# Patient Record
Sex: Male | Born: 1990 | Race: White | Hispanic: No | Marital: Single | Smoking: Never smoker
Health system: Southern US, Community
[De-identification: ages and names within clinical notes are randomized; demographics above are authoritative.]

## PROBLEM LIST (undated history)

## (undated) VITALS — BP 114/79 | HR 103 | Temp 97.7°F | Resp 18 | Ht 70.0 in | Wt 180.0 lb

## (undated) DIAGNOSIS — J45909 Unspecified asthma, uncomplicated: Secondary | ICD-10-CM

## (undated) DIAGNOSIS — T7840XA Allergy, unspecified, initial encounter: Secondary | ICD-10-CM

## (undated) HISTORY — PX: JOINT REPLACEMENT: SHX530

## (undated) HISTORY — DX: Allergy, unspecified, initial encounter: T78.40XA

---
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
## 2012-03-06 ENCOUNTER — Emergency Department (HOSPITAL_COMMUNITY): Payer: BC Managed Care – PPO

## 2012-03-06 ENCOUNTER — Inpatient Hospital Stay (HOSPITAL_COMMUNITY)
Admission: EM | Admit: 2012-03-06 | Discharge: 2012-03-07 | DRG: 750 | Disposition: A | Discharge status: Home / Self Care | Payer: BC Managed Care – PPO | Attending: Internal Medicine | Admitting: Internal Medicine

## 2012-03-06 ENCOUNTER — Encounter (HOSPITAL_COMMUNITY): Payer: Self-pay | Admitting: *Deleted

## 2012-03-06 DIAGNOSIS — F1010 Alcohol abuse, uncomplicated: Secondary | ICD-10-CM

## 2012-03-06 DIAGNOSIS — R404 Transient alteration of awareness: Secondary | ICD-10-CM

## 2012-03-06 DIAGNOSIS — L708 Other acne: Secondary | ICD-10-CM | POA: Diagnosis present

## 2012-03-06 DIAGNOSIS — F10929 Alcohol use, unspecified with intoxication, unspecified: Secondary | ICD-10-CM

## 2012-03-06 DIAGNOSIS — R4182 Altered mental status, unspecified: Secondary | ICD-10-CM | POA: Diagnosis present

## 2012-03-06 DIAGNOSIS — Z79899 Other long term (current) drug therapy: Secondary | ICD-10-CM

## 2012-03-06 DIAGNOSIS — J9600 Acute respiratory failure, unspecified whether with hypoxia or hypercapnia: Secondary | ICD-10-CM | POA: Diagnosis present

## 2012-03-06 DIAGNOSIS — R401 Stupor: Secondary | ICD-10-CM

## 2012-03-06 DIAGNOSIS — F1020 Alcohol dependence, uncomplicated: Secondary | ICD-10-CM | POA: Diagnosis present

## 2012-03-06 DIAGNOSIS — J45909 Unspecified asthma, uncomplicated: Secondary | ICD-10-CM | POA: Diagnosis present

## 2012-03-06 DIAGNOSIS — R1110 Vomiting, unspecified: Secondary | ICD-10-CM | POA: Diagnosis present

## 2012-03-06 HISTORY — DX: Unspecified asthma, uncomplicated: J45.909

## 2012-03-06 LAB — BLOOD GAS, ARTERIAL
MECHVT: 0.53 mL
PEEP: 5 cmH2O
Patient temperature: 97
pH, Arterial: 7.43 (ref 7.350–7.450)

## 2012-03-06 LAB — CBC WITH DIFFERENTIAL/PLATELET
Basophils Relative: 0 % (ref 0–1)
HCT: 44.3 % (ref 39.0–52.0)
Hemoglobin: 15.5 g/dL (ref 13.0–17.0)
Lymphocytes Relative: 14 % (ref 12–46)
Lymphs Abs: 1.6 10*3/uL (ref 0.7–4.0)
Monocytes Absolute: 0.4 10*3/uL (ref 0.1–1.0)
Monocytes Relative: 4 % (ref 3–12)
Neutro Abs: 9.7 10*3/uL — ABNORMAL HIGH (ref 1.7–7.7)
Neutrophils Relative %: 82 % — ABNORMAL HIGH (ref 43–77)
RBC: 5.06 MIL/uL (ref 4.22–5.81)
WBC: 11.8 10*3/uL — ABNORMAL HIGH (ref 4.0–10.5)

## 2012-03-06 LAB — COMPREHENSIVE METABOLIC PANEL
Albumin: 4.2 g/dL (ref 3.5–5.2)
Alkaline Phosphatase: 72 U/L (ref 39–117)
BUN: 10 mg/dL (ref 6–23)
CO2: 25 mEq/L (ref 19–32)
Chloride: 102 mEq/L (ref 96–112)
GFR calc non Af Amer: >90 mL/min (ref >90)
Glucose, Bld: 131 mg/dL — ABNORMAL HIGH (ref 70–99)
Potassium: 4.2 mEq/L (ref 3.5–5.1)
Total Bilirubin: 0.2 mg/dL — ABNORMAL LOW (ref 0.3–1.2)

## 2012-03-06 LAB — URINALYSIS, MICROSCOPIC ONLY
Glucose, UA: NEGATIVE mg/dL
Ketones, ur: NEGATIVE mg/dL
Leukocytes, UA: NEGATIVE
Protein, ur: NEGATIVE mg/dL
Urobilinogen, UA: 0.2 mg/dL (ref 0.0–1.0)

## 2012-03-06 LAB — MRSA PCR SCREENING: MRSA by PCR: INVALID — AB

## 2012-03-06 LAB — RAPID URINE DRUG SCREEN, HOSP PERFORMED
Barbiturates: NOT DETECTED
Tetrahydrocannabinol: NOT DETECTED

## 2012-03-06 MED ORDER — HEPARIN SODIUM (PORCINE) 5000 UNIT/ML IJ SOLN
5000.0000 [IU] | Freq: Three times a day (TID) | INTRAMUSCULAR | Status: DC
  Administered 2012-03-06 – 2012-03-07 (×4): 5000 [IU] via SUBCUTANEOUS
  Filled 2012-03-06 (×7): qty 1

## 2012-03-06 MED ORDER — AMMONIA AROMATIC IN INHA
RESPIRATORY_TRACT | Status: AC
  Administered 2012-03-06: 04:00:00
  Filled 2012-03-06: qty 20

## 2012-03-06 MED ORDER — ONDANSETRON HCL 4 MG/2ML IJ SOLN
4.0000 mg | Freq: Once | INTRAMUSCULAR | Status: AC
  Administered 2012-03-06: 4 mg via INTRAVENOUS
  Filled 2012-03-06: qty 2

## 2012-03-06 MED ORDER — BIOTENE DRY MOUTH MT LIQD
15.0000 mL | Freq: Four times a day (QID) | OROMUCOSAL | Status: DC
  Administered 2012-03-06: 15 mL via OROMUCOSAL

## 2012-03-06 MED ORDER — FAMOTIDINE 40 MG/5ML PO SUSR: 20.0000 mg | Freq: Two times a day (BID) | ORAL | Status: DC

## 2012-03-06 MED ORDER — ETOMIDATE 2 MG/ML IV SOLN
INTRAVENOUS | Status: AC
  Administered 2012-03-06: 20 mg
  Filled 2012-03-06: qty 20

## 2012-03-06 MED ORDER — PROPOFOL 10 MG/ML IV EMUL
5.0000 ug/kg/min | INTRAVENOUS | Status: DC
  Administered 2012-03-06: 20 ug/kg/min via INTRAVENOUS

## 2012-03-06 MED ORDER — CHLORHEXIDINE GLUCONATE 0.12 % MT SOLN
15.0000 mL | Freq: Two times a day (BID) | OROMUCOSAL | Status: DC
  Administered 2012-03-06: 15 mL via OROMUCOSAL
  Filled 2012-03-06: qty 15

## 2012-03-06 MED ORDER — SODIUM CHLORIDE 0.9 % IV SOLN
INTRAVENOUS | Status: DC
  Administered 2012-03-06: 125 mL via INTRAVENOUS

## 2012-03-06 MED ORDER — SUCCINYLCHOLINE CHLORIDE 20 MG/ML IJ SOLN
INTRAMUSCULAR | Status: AC
  Administered 2012-03-06: 120 mg
  Filled 2012-03-06: qty 5

## 2012-03-06 MED ORDER — PROPOFOL 10 MG/ML IV EMUL
INTRAVENOUS | Status: AC
  Administered 2012-03-06: 20 ug/kg/min
  Filled 2012-03-06: qty 100

## 2012-03-06 MED ORDER — LIDOCAINE HCL (CARDIAC) 20 MG/ML IV SOLN
INTRAVENOUS | Status: AC
  Filled 2012-03-06: qty 5

## 2012-03-06 MED ORDER — RANITIDINE HCL 150 MG/10ML PO SYRP
150.0000 mg | ORAL_SOLUTION | Freq: Two times a day (BID) | ORAL | Status: DC
  Administered 2012-03-06 (×2): 150 mg
  Filled 2012-03-06 (×4): qty 10

## 2012-03-06 MED ORDER — ROCURONIUM BROMIDE 50 MG/5ML IV SOLN
INTRAVENOUS | Status: AC
  Filled 2012-03-06: qty 2

## 2012-03-06 MED ORDER — PROPOFOL 10 MG/ML IV EMUL: 5.0000 ug/kg/min | Freq: Once | INTRAVENOUS | Status: DC

## 2012-03-06 MED ORDER — LEVOFLOXACIN IN D5W 500 MG/100ML IV SOLN
500.0000 mg | Freq: Once | INTRAVENOUS | Status: AC
  Administered 2012-03-06: 500 mg via INTRAVENOUS
  Filled 2012-03-06: qty 100

## 2012-03-06 MED ORDER — FENTANYL CITRATE 0.05 MG/ML IJ SOLN: 50.0000 ug | INTRAMUSCULAR | Status: DC | PRN

## 2012-03-06 MED ORDER — SODIUM CHLORIDE 0.9 % IV SOLN: 250.0000 mL | INTRAVENOUS | Status: DC | PRN

## 2012-03-06 NOTE — ED Notes (Signed)
Bed:WA07<BR> Expected date:<BR> Expected time:<BR> Means of arrival:<BR> Comments:<BR>

## 2012-03-06 NOTE — ED Notes (Signed)
CT scan completed, pt tolerated well. RT at bedside for BG

## 2012-03-06 NOTE — H&P (Signed)
Name: Louis Woodward MRN: [id number] DOB: 1990/10/31    LOS: 0  PULMONARY / CRITICAL CARE MEDICINE  HPI:  Louis Woodward is an 21 year-old gentleman with a past medical history significant for asthma and acne, who ingested a large quantity of alcohol over 60-90 minutes.  He began vomiting, and became lethargic, and he was subsequently brought to the ED.  He continued to be unresponsive and was not protecting his airway, and he was intubated.  Past Medical History  Diagnosis Date  . Asthma    History reviewed. No pertinent past surgical history. Prior to Admission medications   Medication Sig Start Date End Date Taking? Authorizing Provider  doxycycline (VIBRA-TABS) 100 MG tablet Take 100 mg by mouth 2 (two) times daily.   Yes Historical Provider, MD   Allergies Allergies  Allergen Reactions  . Cefazolin Rash    Family History No family history on file. Social History  does not have a smoking history on file. He does not have any smokeless tobacco history on file. He reports that he drinks alcohol. His drug history not on file.  Review Of Systems:  Unable to provide.  Brief patient description:  21 year-old male with acute alcohol intoxication  Events Since Admission: None  Current Status: Stable  Vital Signs: Temp:  [95.7 F (35.4 C)-97.5 F (36.4 C)] 96 F (35.6 C) (09/28 0429) Pulse Rate:  [78-98] 95  (09/28 0429) Resp:  [11-20] 16  (09/28 0429) BP: (104-136)/(60-89) 122/69 mmHg (09/28 0429) SpO2:  [100 %] 100 % (09/28 0429) FiO2 (%):  [100 %] 100 % (09/28 0413) Weight:  [83.915 kg (185 lb)] 83.915 kg (185 lb) (09/28 0411)  Physical Examination: General:  Intubated, mechanically ventilated, no acute distress Neuro:  Sedated, synchronous, still unresponsive HEENT:  PERRL, pink conjunctivae, moist membranes Neck:  Supple, no JVD   Cardiovascular:  RRR, no M/R/G Lungs:  Bilateral diminished air entry, no W/R/R Abdomen:  Soft, nontender, nondistended, bowel sounds  present Musculoskeletal:  Moves all extremities, no pedal edema Skin:  No rash  Principal Problem:  *Acute alcohol intoxication Active Problems:  Acute respiratory failure   ASSESSMENT AND PLAN  PULMONARY No results found for this basename: PHART:5,PCO2:5,PCO2ART:5,PO2ART:5,HCO3:5,O2SAT:5 in the last 168 hours Ventilator Settings: Vent Mode:  [-] PRVC FiO2 (%):  [100 %] 100 % Set Rate:  [15 bmp] 15 bmp Vt Set:  [600 mL] 600 mL PEEP:  [5 cmH20] 5 cmH20 Plateau Pressure:  [14 cmH20] 14 cmH20 CXR:  No infiltrates ETT:  9/28 >>>  A:  Acute respiratory failure P:   -Will need to metabolize the alcohol, and will likely be able to be extubated later today. -Will get a single dose of levofloxacin for VAP prohpylaxis  NEUROLOGIC  A:  Altered mental status, secondary to acute alcohol intoxication P:   -Will allow to wake up.  BEST PRACTICE / DISPOSITION - Level of Care:  ICU - Primary Service:  PCCM - Consultants:  None - Code Status:  Full - Diet:  NPO - DVT Px:  Heparin - GI Px:  Famotidine - Social / Family:  Family updated, questions answered at bedside.  The patient is critically ill with multiple organ systems failure and requires high complexity decision making for assessment and support, frequent evaluation and titration of therapies, application of advanced monitoring technologies and extensive interpretation of multiple databases.   Critical Care Time devoted to patient care services described in this note is: 60 Minutes  Lavella Hammock, M.D. Pulmonary and Critical  Care Medicine [facility] Pager: [phone]  03/06/2012, 4:56 AM

## 2012-03-06 NOTE — ED Provider Notes (Signed)
History     CSN: [id number]  Arrival date & time 03/06/12  0243   First MD Initiated Contact with Patient 03/06/12 0300      Chief Complaint  Patient presents with  . Alcohol Intoxication    (Consider location/radiation/quality/duration/timing/severity/associated sxs/prior treatment) HPI 21 year old male presents to emergency room with reported alcohol intoxication. Patient is unable to give history. Patient was out at a party with friends, brought back from a party in the flat bed of the truck, EMS reports vomiting in route. Family members at bedside, unable to give any further history. They report no other friends are giving any additional history.  Past Medical History  Diagnosis Date  . Asthma     History reviewed. No pertinent past surgical history.  No family history on file.  History  Substance Use Topics  . Smoking status: Not on file  . Smokeless tobacco: Not on file  . Alcohol Use: Yes      Review of Systems  Unable to perform ROS: Mental status change   intoxication  Allergies  Cefazolin  Home Medications   Current Outpatient Rx  Name Route Sig Dispense Refill  . DOXYCYCLINE HYCLATE 100 MG PO TABS Oral Take 100 mg by mouth 2 (two) times daily.      BP 136/89  Pulse 89  Temp 97.5 F (36.4 C) (Oral)  Resp 18  SpO2 100%  Physical Exam  Nursing note and vitals reviewed. Constitutional:       Patient is unresponsive other than to pain initially, then to voice briefly.  HENT:  Head: Normocephalic and atraumatic.  Right Ear: External ear normal.  Left Ear: External ear normal.  Nose: Nose normal.  Mouth/Throat: Oropharynx is clear and moist.  Eyes: Conjunctivae normal and EOM are normal. Pupils are equal, round, and reactive to light.  Neck: Normal range of motion. Neck supple. No JVD present. No tracheal deviation present. No thyromegaly present.  Cardiovascular: Normal rate, regular rhythm, normal heart sounds and intact distal pulses.  Exam  reveals no gallop and no friction rub.   No murmur heard. Pulmonary/Chest: Effort normal and breath sounds normal. No stridor. No respiratory distress. He has no wheezes. He has no rales. He exhibits no tenderness.  Abdominal: Soft. Bowel sounds are normal. He exhibits no distension and no mass. There is no tenderness. There is no rebound and no guarding.  Musculoskeletal: He exhibits no edema and no tenderness.  Lymphadenopathy:    He has no cervical adenopathy.  Skin: Skin is warm and dry. No rash noted. He is not diaphoretic. No erythema. No pallor.    ED Course  INTUBATION Date/Time: 03/06/2012 4:02 AM Performed by: Olivia Mackie Authorized by: Olivia Mackie Consent: The procedure was performed in an emergent situation. Risks and benefits: risks, benefits and alternatives were discussed Consent given by: parent Patient identity confirmed: verbally with patient Time out: Immediately prior to procedure a "time out" was called to verify the correct patient, procedure, equipment, support staff and site/side marked as required. Indications: airway protection Intubation method: video-assisted Patient status: paralyzed (RSI) Preoxygenation: BVM Sedatives: etomidate Paralytic: succinylcholine Laryngoscope size: Miller 4 Tube size: 7.0 mm Tube type: cuffed Number of attempts: 1 Cricoid pressure: no Cords visualized: yes Post-procedure assessment: chest rise and CO2 detector Breath sounds: equal Cuff inflated: no ETT to lip: 22 cm Tube secured with: adhesive tape Chest x-ray interpreted by me and radiologist. Chest x-ray findings: endotracheal tube in appropriate position Patient tolerance: Patient tolerated the procedure  well with no immediate complications.   (including critical care time)  Labs Reviewed  ETHANOL - Abnormal; Notable for the following:    Alcohol, Ethyl (B) 289 (*)     All other components within normal limits  CBC WITH DIFFERENTIAL - Abnormal; Notable for  the following:    WBC 11.8 (*)     Neutrophils Relative 82 (*)     Neutro Abs 9.7 (*)     All other components within normal limits  COMPREHENSIVE METABOLIC PANEL - Abnormal; Notable for the following:    Glucose, Bld 131 (*)     Total Bilirubin 0.2 (*)     All other components within normal limits  URINE RAPID DRUG SCREEN (HOSP PERFORMED)  URINALYSIS, MICROSCOPIC ONLY   No results found.  CRITICAL CARE Performed by: Olivia Mackie   Total critical care time: 45  Critical care time was exclusive of separately billable procedures and treating other patients.  Critical care was necessary to treat or prevent imminent or life-threatening deterioration.  Critical care was time spent personally by me on the following activities: development of treatment plan with patient and/or surrogate as well as nursing, discussions with consultants, evaluation of patient's response to treatment, examination of patient, obtaining history from patient or surrogate, ordering and performing treatments and interventions, ordering and review of laboratory studies, ordering and review of radiographic studies, pulse oximetry and re-evaluation of patient's condition.   1. Altered mental status   2. Obtunded   3. Alcohol intoxication       MDM  21 year old male with probable alcohol intoxication. He is right now on the border of needing intubation, but appears to be a protecting his airway a little better now than on presentation, and we'll wait to voice. Will closely monitor and intubate if necessary if he has decompensation in his mental status     4:52 AM Patient with worsening mental status, no longer able to arouse with ammonia capsule, painful stimuli. No gag noted. Decision made intubate. Discuss with parents. Time out call prior to intubation. No difficulties during intubation. Please see note above regarding intubation. To be admitted to critical care for monitoring and further sobriety.  Olivia Mackie, MD 03/06/12 [phone]

## 2012-03-06 NOTE — ED Notes (Signed)
Dr Norlene Campbell at bedside, no response to sternal rub, no gag reflex. RT called for intubation

## 2012-03-06 NOTE — ED Notes (Signed)
Pt moved to resus B from room 7, parents at bedside. Per parents pt seen at 2230, had not been drinking at that time. Parent told by friend pt passed out ?2340 while with a group of friends, unkn amount of etoh consumed, per parents pt does not drink alcohol. Pt found in bed of truck in barn for up to 3 hours. Temperature per foley 95.4, rectal 96.4, bair hugger placed on pt. Family updated on status.

## 2012-03-06 NOTE — ED Notes (Signed)
Per EMS pt drank 6-7 drinks of liquor; unresponsive; vomiting; picked up at a barn in the back of a truck; friends at scene

## 2012-03-06 NOTE — ED Notes (Signed)
0402-DrNorlene Campbell at bedside, timeout for intubation done, RT x 2, RN x 2, NT x 2 at bedside.  0405-Meds given per MD order. 0406-Intubation completed by Dr. Norlene Campbell, 1st pass, 7.0 ETT, 22cm taped at lip, +color change, + BS confirmed x 2.  0409-OG placed

## 2012-03-06 NOTE — Progress Notes (Signed)
Name: Jejuan Scala MRN: [id number] DOB: [birth year]    LOS: 0  PULMONARY / CRITICAL CARE MEDICINE  HPI:  Mr. Pavon is an 21 year-old gentleman with a past medical history significant for asthma and acne, who ingested a large quantity of alcohol over 60-90 minutes.  He began vomiting, and became lethargic, and he was subsequently brought to the ED.  He continued to be unresponsive and was not protecting his airway, and he was intubated.  Brief patient description:  21 year-old male with acute alcohol intoxication  Events Since Admission: None  Current Status: Stable  Vital Signs: Temp:  [95.7 F (35.4 C)-98.7 F (37.1 C)] 98.7 F (37.1 C) (09/28 0800) Pulse Rate:  [78-105] 104  (09/28 0800) Resp:  [11-20] 15  (09/28 0800) BP: (104-136)/(60-89) 121/68 mmHg (09/28 0800) SpO2:  [99 %-100 %] 100 % (09/28 0800) FiO2 (%):  [30 %-100 %] 30 % (09/28 0852) Weight:  [83.915 kg (185 lb)-86 kg (189 lb 9.5 oz)] 86 kg (189 lb 9.5 oz) (09/28 0600)  Physical Examination: General:  Intubated, mechanically ventilated, no acute distress Neuro:  Sedated, synchronous, arousable HEENT:  PERRL, moist membranes Neck:  Supple, no JVD   Cardiovascular:  RRR, no M/R/G Lungs:  Bilateral diminished air entry, no W/R/R Abdomen:  Soft, nontender, nondistended, bowel sounds present Musculoskeletal:  Moves all extremities, no pedal edema Skin:  No rash  Principal Problem:  *Acute alcohol intoxication Active Problems:  Acute respiratory failure   ASSESSMENT AND PLAN  PULMONARY  Lab 03/06/12 0520  PHART 7.430  PCO2ART 37.7  PO2ART 571.0*  HCO3 24.9*  O2SAT 99.6   Ventilator Settings: Vent Mode:  [-] CPAP FiO2 (%):  [30 %-100 %] 30 % Set Rate:  [15 bmp] 15 bmp Vt Set:  [530 mL-600 mL] 530 mL PEEP:  [5 cmH20] 5 cmH20 Pressure Support:  [5 cmH20] 5 cmH20 Plateau Pressure:  [13 cmH20-14 cmH20] 14 cmH20 CXR:  No infiltrates ETT:  9/28 >>>  A:  Acute respiratory failure P:   -SBT to extubate  today. -D/C abx.  NEUROLOGIC  A:  Altered mental status, secondary to acute alcohol intoxication P:   -Will allow to wake up.  The patient is critically ill with multiple organ systems failure and requires high complexity decision making for assessment and support, frequent evaluation and titration of therapies, application of advanced monitoring technologies and extensive interpretation of multiple databases.   CC time 35 min.  Alyson Reedy, M.D. [facility] Pulmonary/Critical Care Medicine. Pager: [phone]. After hours pager: [phone].

## 2012-03-07 NOTE — Progress Notes (Signed)
Patient discharged home with mother.

## 2012-03-07 NOTE — Discharge Summary (Signed)
Physician Discharge Summary     Patient ID: Louis Woodward MRN: [id number] DOB/AGE: Jan 12, 1991 20 y.o.  Admit date: 03/06/2012 Discharge date: 03/07/2012  Admission Diagnoses: Acute encephalopathy, acute respiratory failure. Discharge Diagnoses:  Principal Problem:  *Acute alcohol intoxication Active Problems:  Acute respiratory failure   Significant Hospital tests/ studies/ interventions and procedures  HPI: Louis Woodward is an 21 year-old gentleman with a past medical history significant for asthma and acne, who ingested a large quantity of alcohol over 60-90 minutes. He began vomiting, and became lethargic, and he was subsequently brought to the ED. He continued to be unresponsive and was not protecting his airway, and he was intubated.  Hospital Course:  Altered mental status, secondary to acute alcohol intoxication resulting in acute respiratory failure.  He was admitted after being intubated for respiratory failure in the setting of inability to protect his airway. History supportively with IV hydration, and time. He was successfully extubated the following day after admission. He is now back to baseline cognitive status. All problems have resolved.  Discharge Exam: BP 122/62  Pulse 62  Temp 98.1 F (36.7 C) (Oral)  Resp 16  Ht 5\' 11"  (1.803 m)  Wt 86.592 kg (190 lb 14.4 oz)  BMI 26.63 kg/m2  SpO2 98%  Physical Examination:  General: extubated, no focal def  HEENT: PERRL, moist membranes  Neck: Supple, no JVD  Cardiovascular: RRR, no M/R/G  Lungs: Bilateral clear  Abdomen: Soft, nontender, nondistended, bowel sounds present  Musculoskeletal: Moves all extremities, no pedal edema   Labs at discharge Lab Results  Component Value Date   CREATININE 0.62 03/06/2012   BUN 10 03/06/2012   NA 139 03/06/2012   K 4.2 03/06/2012   CL 102 03/06/2012   CO2 25 03/06/2012   Lab Results  Component Value Date   WBC 11.8* 03/06/2012   HGB 15.5 03/06/2012   HCT 44.3 03/06/2012   MCV  87.5 03/06/2012   PLT 182 03/06/2012   Lab Results  Component Value Date   ALT 19 03/06/2012   AST 19 03/06/2012   ALKPHOS 72 03/06/2012   BILITOT 0.2* 03/06/2012   No results found for this basename: INR,  PROTIME    Current radiology studies Ct Head Wo Contrast  03/06/2012  *RADIOLOGY REPORT*  Clinical Data: Unresponsive.  CT HEAD WITHOUT CONTRAST  Technique:  Contiguous axial images were obtained from the base of the skull through the vertex without contrast.  Comparison: None.  Findings: No acute bony abnormalities.  No significant sinus disease.  Negative for acute hemorrhage, mass lesion, midline shift, hydrocephalus or large infarct.  IMPRESSION: Negative head CT.   Original Report Authenticated By: Richarda Overlie, M.D.    Dg Chest Portable 1 View  03/06/2012  *RADIOLOGY REPORT*  Clinical Data: Alcohol intoxication and evaluate endotracheal tube.  PORTABLE CHEST - 1 VIEW  Comparison: 03/29/2008  Findings: Endotracheal tube is 4.9 cm above the carina. Nasogastric tube is in the distal esophagus region.  The lungs are clear without airspace disease.  Normal appearance of the heart and mediastinum.  IMPRESSION: Endotracheal tube is appropriately positioned above the carina.  Nasogastric tube appears to be in the distal esophagus and should be advanced for optimal positioning.  No focal chest disease.   Original Report Authenticated By: Richarda Overlie, M.D.     Disposition:  home       Discharge Orders    Future Orders Please Complete By Expires   Diet - low sodium heart healthy  Increase activity slowly          Medication List     As of 03/07/2012  8:52 AM    STOP taking these medications         doxycycline 100 MG tablet   Commonly known as: VIBRA-TABS        Follow-up Information    Follow up with urgent care or emergency room or choice. (As needed)          Discharged Condition: {good Physician Statement:   The Patient was personally examined, the discharge assessment  and plan has been personally reviewed and I agree with ACNP Babcock's assessment and plan. > 30 minutes of time have been dedicated to discharge assessment, planning and discharge instructions.   SignedAnders Simmonds 03/07/2012, 8:52 AM  Patient still follows up with his Pediatrician whom I encouraged that he makes an appointment this week and the patient's mother said she will.  I also encouraged him to abstain of alcohol and that if he starts having fevers or cough of purulent sputum to return to the hospital or urgent care center immediately and explained to him and his mother what aspiration is.  We also discussed alcohol poisoning symptoms, what to watch for and when to come to the hospital.  I saw patient, supervised his care and agree with the note above.  Alyson Reedy, M.D. [facility] Pulmonary/Critical Care Service Pager: [phone]

## 2012-03-08 LAB — MRSA CULTURE

## 2012-03-10 ENCOUNTER — Emergency Department (HOSPITAL_COMMUNITY)
Admission: EM | Admit: 2012-03-10 | Discharge: 2012-03-11 | Disposition: A | Discharge status: Other Acute Inpatient Hospital | Payer: BC Managed Care – PPO | Attending: Emergency Medicine | Admitting: Emergency Medicine

## 2012-03-10 ENCOUNTER — Encounter (HOSPITAL_COMMUNITY): Payer: Self-pay | Admitting: Family Medicine

## 2012-03-10 DIAGNOSIS — F309 Manic episode, unspecified: Secondary | ICD-10-CM | POA: Insufficient documentation

## 2012-03-10 DIAGNOSIS — J45909 Unspecified asthma, uncomplicated: Secondary | ICD-10-CM | POA: Insufficient documentation

## 2012-03-10 DIAGNOSIS — F411 Generalized anxiety disorder: Secondary | ICD-10-CM | POA: Insufficient documentation

## 2012-03-10 DIAGNOSIS — F419 Anxiety disorder, unspecified: Secondary | ICD-10-CM

## 2012-03-10 LAB — COMPREHENSIVE METABOLIC PANEL
AST: 15 U/L (ref 0–37)
BUN: 10 mg/dL (ref 6–23)
CO2: 29 mEq/L (ref 19–32)
Calcium: 9.7 mg/dL (ref 8.4–10.5)
Creatinine, Ser: 0.74 mg/dL (ref 0.50–1.35)
GFR calc Af Amer: >90 mL/min (ref >90)
GFR calc non Af Amer: >90 mL/min (ref >90)
Glucose, Bld: 99 mg/dL (ref 70–99)
Total Bilirubin: 0.5 mg/dL (ref 0.3–1.2)

## 2012-03-10 LAB — SALICYLATE LEVEL: Salicylate Lvl: <2 mg/dL — ABNORMAL LOW (ref 2.8–20.0)

## 2012-03-10 LAB — CBC
HCT: 46.2 % (ref 39.0–52.0)
MCH: 30 pg (ref 26.0–34.0)
MCV: 87.2 fL (ref 78.0–100.0)
Platelets: 213 10*3/uL (ref 150–400)
RBC: 5.3 MIL/uL (ref 4.22–5.81)

## 2012-03-10 LAB — ETHANOL: Alcohol, Ethyl (B): <11 mg/dL (ref 0–11)

## 2012-03-10 LAB — RAPID URINE DRUG SCREEN, HOSP PERFORMED: Opiates: NOT DETECTED

## 2012-03-10 MED ORDER — NICOTINE 21 MG/24HR TD PT24
21.0000 mg | MEDICATED_PATCH | Freq: Every day | TRANSDERMAL | Status: DC
  Filled 2012-03-10: qty 1

## 2012-03-10 MED ORDER — ACETAMINOPHEN 325 MG PO TABS: 650.0000 mg | ORAL_TABLET | ORAL | Status: DC | PRN

## 2012-03-10 MED ORDER — IBUPROFEN 600 MG PO TABS: 600.0000 mg | ORAL_TABLET | Freq: Three times a day (TID) | ORAL | Status: DC | PRN

## 2012-03-10 MED ORDER — LORAZEPAM 1 MG PO TABS
1.0000 mg | ORAL_TABLET | Freq: Once | ORAL | Status: AC
Start: 2012-03-10 — End: 2012-03-10
  Administered 2012-03-10: 1 mg via ORAL
  Filled 2012-03-10: qty 1

## 2012-03-10 MED ORDER — ALUM & MAG HYDROXIDE-SIMETH 200-200-20 MG/5ML PO SUSP: 30.0000 mL | ORAL | Status: DC | PRN

## 2012-03-10 MED ORDER — ZOLPIDEM TARTRATE 5 MG PO TABS: 5.0000 mg | ORAL_TABLET | Freq: Every evening | ORAL | Status: DC | PRN

## 2012-03-10 MED ORDER — ONDANSETRON HCL 4 MG PO TABS: 4.0000 mg | ORAL_TABLET | Freq: Three times a day (TID) | ORAL | Status: DC | PRN

## 2012-03-10 MED ORDER — LORAZEPAM 1 MG PO TABS: 1.0000 mg | ORAL_TABLET | Freq: Three times a day (TID) | ORAL | Status: DC | PRN

## 2012-03-10 NOTE — ED Notes (Signed)
Verified with [facility] that pt does not have a bed assigned at this time. Pt in bathroom changing into scrubs and obtaining urine specimen.

## 2012-03-10 NOTE — ED Notes (Signed)
Pt reports feeling anxious and unsure of why he is back here and the process.  Mother present upset with situation.  Reports was unsure of the process and why he is here.  She felt he was misinformed about process- This Clinical research associate. Regina NT, and social worker present to discuss process with this pt and mother.Pt and family able to express their feelings and concerns openly. The process discussed and expectations understood.  Visitation policy discussed.  Pt at present calm and cooperative with care.

## 2012-03-10 NOTE — ED Notes (Signed)
Emily, PA at bedside.

## 2012-03-10 NOTE — ED Provider Notes (Signed)
History     CSN: [id number]  Arrival date & time 03/10/12  1532   First MD Initiated Contact with Patient 03/10/12 1550      Chief Complaint  Patient presents with  . Medical Clearance    (Consider location/radiation/quality/duration/timing/severity/associated sxs/prior treatment) HPI Comments: Patient presents to the ED for medical clearance.  Pt was sent from behavioral health across the street for medical clearance.  Pt was recently seen in the ED for alcohol overdose requiring intubation.  Pt reports that he has had problems since January with his mind racing, feeling like he is going to jump out of his skin, feeling like he is going to explode, hearing voices in his head (never negative or delivering commands).  Mother reports patient's behavior has been out of control - most recently with alcohol, dangerous activities (e.g. Riding on the outside of a car going down the freeway), questionable decisions (e.g. Dating his best friend's 43 year old mother).  Patient was seen by his psychiatrist today who suspects patient may be bipolar, though he has not been diagnosed yet - asked patient to come to ED for medical clearance prior to placement at behavioral health.  Pt denies CP, SOB, weakness or numbness of the exteremities.  Notes that he feels like his chest is "going to explode" but states that he feels like he is going to explode all over and this is not different from the sensation over the rest of his body.  Denies SI, HI, depression.   The history is provided by the patient and a parent.    Past Medical History  Diagnosis Date  . Asthma     History reviewed. No pertinent past surgical history.  History reviewed. No pertinent family history.  History  Substance Use Topics  . Smoking status: Never Smoker   . Smokeless tobacco: Not on file  . Alcohol Use: Yes      Review of Systems  Constitutional: Negative for fever and chills.  HENT: Negative for sore throat.     Respiratory: Negative for cough and shortness of breath.   Gastrointestinal: Negative for nausea, vomiting, abdominal pain and diarrhea.  Neurological: Negative for weakness and numbness.  Psychiatric/Behavioral: Negative for suicidal ideas. The patient is nervous/anxious and is hyperactive.     Allergies  Cefazolin  Home Medications   Current Outpatient Rx  Name Route Sig Dispense Refill  . DOXYCYCLINE HYCLATE 100 MG PO TABS Oral Take 100 mg by mouth daily.      BP 138/69  Pulse 97  Temp 98.7 F (37.1 C) (Oral)  Resp 20  SpO2 98%  Physical Exam  Nursing note and vitals reviewed. Constitutional: He appears well-developed and well-nourished. No distress.  HENT:  Head: Normocephalic and atraumatic.  Neck: Neck supple.  Cardiovascular: Normal rate and regular rhythm.   Pulmonary/Chest: Effort normal and breath sounds normal. No respiratory distress. He has no wheezes. He has no rales.  Abdominal: Soft. He exhibits no distension and no mass. There is no tenderness. There is no rebound and no guarding.  Neurological: He is alert. He exhibits normal muscle tone.  Skin: He is not diaphoretic.  Psychiatric: His mood appears anxious. He expresses no suicidal ideation.    ED Course  Procedures (including critical care time)  Labs Reviewed  SALICYLATE LEVEL - Abnormal; Notable for the following:    Salicylate Lvl <2.0 (*)     All other components within normal limits  ACETAMINOPHEN LEVEL  CBC  COMPREHENSIVE METABOLIC PANEL  ETHANOL  URINE RAPID DRUG SCREEN (HOSP PERFORMED)  TSH   No results found.  5:35 PM I spoke with Jessie Foot, ACT team, who will see and assess the patient for placement.    1. Anxiety   2. Mania      MDM  Patient with several months of worsening symptoms - anxiety, behavioral issues, polysubstance abuse.  Pt to be seen and assessed by ACT, placed for further psychiatric evaluation and treatment.  Dr Bernette Mayers made aware of patient at change of  shift.       [place], [place] 03/11/12 0130

## 2012-03-10 NOTE — ED Notes (Signed)
Pt states he was here on 9/28 for overdose and discharged the next day. States the overdose then was not an attempt to kill himself. Denies SI at this time. Denies HI or hallucinations. States he has been drinking a lot of alcohol recently to help with his stress.  States when he has been drinking he drinks a bottle of vodka.  Denies any drug use. States last drink was 9/27 before he overdosed.  States he went to see a psychiatrist today and they told him to come here.

## 2012-03-10 NOTE — ED Notes (Signed)
Patient in paper scrubs. Belongings and patient wanded by security.  Pt mother and patient made aware of plan of care.

## 2012-03-10 NOTE — BH Assessment (Addendum)
Assessment Note   Louis Woodward is an 21 y.o. male who presents to [facility] voluntarily at the suggestion of his Psychiatrist Dr. Toni Arthurs [phone]). Per Dr. Toni Arthurs, pt is experiencing a manic episode. He reports pt has not slept in 5 days, has been demonstrating impulsive behavior including binge drinking, and appears to be a danger to himself at this time.  Pt denies SI and HI. He endorses [facility], stating he has been hearing people he knows talking to him when in reality no one has been talking to him. He also states that he has been seeing a three headed dog in his room. He states he has not been able to sleep for the past 5 days and was getting 2-4 hours a night prior to that. He states he has felt a decreased need for sleep over the past year. He reports impulsive behaviors have gotten worse since the death of his grandfather in 11-18-2022. He denies and symptoms of depression. He has no prior history of mental health concerns and started seeing Dr. Toni Arthurs for the first time today.   He endorses binge drinking, stating he will drink 16 cups of beer or more on occasion. He states he began drinking in July, shortly before starting college at [employer]. He denies any other drug use.         Pt appears restless and speaks rapidly. He reports that he feels fidgety, has been having racing thoughts, racing heart, has times where he can not make sense of words when he is trying to read, and has times when he feels like he can not talk. He currently lives with his parents. He states his mom is very supportive of him.    Axis I: Mood Disorder NOS Axis II: Deferred Axis III:  Past Medical History  Diagnosis Date  . Asthma    Axis IV: other psychosocial or environmental problems Axis V: 21-30 behavior considerably influenced by delusions or hallucinations OR serious impairment in judgment, communication OR inability to function in almost all areas  Past Medical History:  Past Medical History  Diagnosis Date  . Asthma      History reviewed. No pertinent past surgical history.  Family History: History reviewed. No pertinent family history.  Social History:  reports that he has never smoked. He does not have any smokeless tobacco history on file. He reports that he drinks alcohol. He reports that he does not use illicit drugs.  Additional Social History:  Alcohol / Drug Use History of alcohol / drug use?: Yes Substance #1 Name of Substance 1: Alcohol 1 - Age of First Use: 20 1 - Amount (size/oz): reports binge drinking up to 16 cups of beer or more  1 - Frequency: 1 time monthly 1 - Duration: 3 months 1 - Last Use / Amount: 03/06/12  CIWA: CIWA-Ar BP: 104/59 mmHg Pulse Rate: 84  COWS:    Allergies:  Allergies  Allergen Reactions  . Cefazolin Rash    Home Medications:  (Not in a hospital admission)  OB/GYN Status:  No LMP for male patient.  General Assessment Data Location of Assessment: [facility] Living Arrangements: Parent Can pt return to current living arrangement?: Yes Admission Status: Voluntary Is patient capable of signing voluntary admission?: Yes Transfer from: Acute Hospital Referral Source: Self/Family/Friend  Education Status Is patient currently in school?: Yes Current Grade: freshman in college Highest grade of school patient has completed: highschool Name of school: [employer]  Risk to self Suicidal Ideation: No Suicidal Intent: No  Is patient at risk for suicide?: Yes Suicidal Plan?: No Access to Means: No What has been your use of drugs/alcohol within the last 12 months?: binge drinking alcohol Previous Attempts/Gestures: No How many times?: 0  Other Self Harm Risks: none Triggers for Past Attempts: None known Intentional Self Injurious Behavior: None Family Suicide History: No Recent stressful life event(s): Loss (Comment) (grandfather died) Persecutory voices/beliefs?: No Depression: No Depression Symptoms: Feeling angry/irritable Substance abuse history and/or  treatment for substance abuse?: Yes Suicide prevention information given to non-admitted patients: Not applicable  Risk to Others Homicidal Ideation: No Thoughts of Harm to Others: No Current Homicidal Intent: No Current Homicidal Plan: No Access to Homicidal Means: No Identified Victim: none History of harm to others?: No Assessment of Violence: None Noted Violent Behavior Description: cooperative Does patient have access to weapons?: No Criminal Charges Pending?: No Does patient have a court date: No  Psychosis Hallucinations: Auditory;Visual Delusions: None noted  Mental Status Report Appear/Hygiene: Other (Comment) (casual) Eye Contact: Good Motor Activity: Unremarkable Speech: Logical/coherent Level of Consciousness: Alert Mood: Anxious Affect: Anxious Anxiety Level: Minimal Thought Processes: Coherent;Relevant Judgement: Unimpaired Orientation: Place;Time;Situation;Person Obsessive Compulsive Thoughts/Behaviors: None  Cognitive Functioning Concentration: Normal Memory: Recent Intact;Remote Intact IQ: Average Insight: Fair Impulse Control: Poor Appetite: Fair Weight Loss: 0  Weight Gain: 0  Sleep: Decreased Total Hours of Sleep: 2  Vegetative Symptoms: None  ADLScreening [facility]) Patient's cognitive ability adequate to safely complete daily activities?: Yes Patient able to express need for assistance with ADLs?: Yes Independently performs ADLs?: Yes (appropriate for developmental age)  Abuse/Neglect [facility]) Physical Abuse: Denies Verbal Abuse: Denies Sexual Abuse: Denies  Prior Inpatient Therapy Prior Inpatient Therapy: No Prior Therapy Dates: na Prior Therapy Facilty/Provider(s): na Reason for Treatment: na  Prior Outpatient Therapy Prior Outpatient Therapy: Yes Prior Therapy Dates: ongoing Prior Therapy Facilty/Provider(s): Dr. Toni Arthurs Reason for Treatment: manic episode and medication management  ADL Screening (condition at  time of admission) Patient's cognitive ability adequate to safely complete daily activities?: Yes Patient able to express need for assistance with ADLs?: Yes Independently performs ADLs?: Yes (appropriate for developmental age) Weakness of Legs: None Weakness of Arms/Hands: None  Home Assistive Devices/Equipment Home Assistive Devices/Equipment: None    Abuse/Neglect Assessment (Assessment to be complete while patient is alone) Physical Abuse: Denies Verbal Abuse: Denies Sexual Abuse: Denies Exploitation of patient/patient's resources: Denies Self-Neglect: Denies     Merchant navy officer (For Healthcare) Advance Directive: Patient does not have advance directive;Patient would not like information Pre-existing out of facility DNR order (yellow form or pink MOST form): No Nutrition Screen- [facility] Patient's home diet: Regular Have you recently lost weight without trying?: No Have you been eating poorly because of a decreased appetite?: No Malnutrition Screening Tool Score: 0   Additional Information 1:1 In Past 12 Months?: No CIRT Risk: No Elopement Risk: No Does patient have medical clearance?: Yes     Disposition:  Disposition Disposition of Patient: Referred to;Inpatient treatment program Type of inpatient treatment program: Adult Pt has been accepted at [facility] by Donell Sievert to the care of Dr. Dan Humphreys. EDP notified and agrees with plan. Support paper work has been signed and faxed to [facility] [place]. On Site Evaluation by:   Reviewed with Physician:     Georgina Quint A 03/10/2012 10:51 PM

## 2012-03-10 NOTE — ED Notes (Signed)
One pt belongings bag given to pt's mother.

## 2012-03-10 NOTE — ED Notes (Signed)
Pt continues to want to discuss plan of care- Spoke with Toni Arthurs MD psych MD on the phone and updated about pt current status.  Made aware of ACT consult pending.  Marcelle Smiling RN introduced

## 2012-03-10 NOTE — ED Notes (Signed)
Denies SI/HI at this time.  Pt reports "having racing thoughts unable to remain focus".  Feeling overwhelmed and loss of coping.  Recently here as a pt for overdose.  Spoke with psych today was referred here for evaluation.  Pt reports relationship with older male 85yrs (best friends mother) best friend was unaware.  Mother present good support

## 2012-03-10 NOTE — ED Notes (Signed)
Pt reports having chest tightness from anxiety, sweaty, numbness to extremities and unable to sleep

## 2012-03-11 ENCOUNTER — Inpatient Hospital Stay (HOSPITAL_COMMUNITY)
Admission: AD | Admit: 2012-03-11 | Discharge: 2012-03-16 | DRG: 430 | Disposition: A | Discharge status: Home / Self Care | Payer: BC Managed Care – PPO | Source: Ambulatory Visit | Attending: Psychiatry | Admitting: Psychiatry

## 2012-03-11 ENCOUNTER — Encounter (HOSPITAL_COMMUNITY): Payer: Self-pay | Admitting: Intensive Care

## 2012-03-11 DIAGNOSIS — F3110 Bipolar disorder, current episode manic without psychotic features, unspecified: Secondary | ICD-10-CM | POA: Diagnosis present

## 2012-03-11 DIAGNOSIS — F901 Attention-deficit hyperactivity disorder, predominantly hyperactive type: Secondary | ICD-10-CM | POA: Diagnosis present

## 2012-03-11 DIAGNOSIS — J45909 Unspecified asthma, uncomplicated: Secondary | ICD-10-CM | POA: Diagnosis present

## 2012-03-11 DIAGNOSIS — F10988 Alcohol use, unspecified with other alcohol-induced disorder: Secondary | ICD-10-CM | POA: Diagnosis present

## 2012-03-11 DIAGNOSIS — F3130 Bipolar disorder, current episode depressed, mild or moderate severity, unspecified: Principal | ICD-10-CM | POA: Diagnosis present

## 2012-03-11 DIAGNOSIS — F909 Attention-deficit hyperactivity disorder, unspecified type: Secondary | ICD-10-CM | POA: Diagnosis present

## 2012-03-11 DIAGNOSIS — F1020 Alcohol dependence, uncomplicated: Secondary | ICD-10-CM | POA: Diagnosis present

## 2012-03-11 DIAGNOSIS — F1010 Alcohol abuse, uncomplicated: Secondary | ICD-10-CM | POA: Diagnosis present

## 2012-03-11 LAB — TSH: TSH: 0.914 u[IU]/mL (ref 0.350–4.500)

## 2012-03-11 MED ORDER — MAGNESIUM HYDROXIDE 400 MG/5ML PO SUSP: 30.0000 mL | Freq: Every day | ORAL | Status: DC | PRN

## 2012-03-11 MED ORDER — ALUM & MAG HYDROXIDE-SIMETH 200-200-20 MG/5ML PO SUSP: 30.0000 mL | ORAL | Status: DC | PRN

## 2012-03-11 MED ORDER — RISPERIDONE 1 MG PO TABS
1.0000 mg | ORAL_TABLET | Freq: Two times a day (BID) | ORAL | Status: DC
  Administered 2012-03-11 – 2012-03-12 (×2): 1 mg via ORAL
  Filled 2012-03-11 (×6): qty 1

## 2012-03-11 MED ORDER — ACETAMINOPHEN 325 MG PO TABS: 650.0000 mg | ORAL_TABLET | Freq: Four times a day (QID) | ORAL | Status: DC | PRN

## 2012-03-11 MED ORDER — DOXYCYCLINE HYCLATE 100 MG PO TABS
100.0000 mg | ORAL_TABLET | Freq: Two times a day (BID) | ORAL | Status: DC
  Administered 2012-03-11: 100 mg via ORAL
  Filled 2012-03-11: qty 1

## 2012-03-11 NOTE — [facility] Suicide Risk Assessment (Signed)
Suicide Risk Assessment  Admission Assessment     Nursing information obtained from:  Patient Demographic factors:  Male;Caucasian;Unemployed Current Mental Status:   (Pt currently denies SI/HI) Loss Factors:    Historical Factors:    Risk Reduction Factors:  Sense of responsibility to family;Living with another person, especially a relative;Positive social support  CLINICAL FACTORS:   Bipolar Disorder:   Bipolar II Previous Psychiatric Diagnoses and Treatments  COGNITIVE FEATURES THAT CONTRIBUTE TO RISK:  Thought constriction (tunnel vision)    SUICIDE RISK:   Moderate:  Frequent suicidal ideation with limited intensity, and duration, some specificity in terms of plans, no associated intent, good self-control, limited dysphoria/symptomatology, some risk factors present, and identifiable protective factors, including available and accessible social support.  Reason for hospitalization: .racing thoughts and insomnia referred by outpatient psychiatrist  Diagnosis:   Axis I: Bipolar, Manic Axis II: Deferred Axis III:  Past Medical History  Diagnosis Date  . Asthma    Axis IV: other psychosocial or environmental problems Axis V: 21-30 behavior considerably influenced by delusions or hallucinations OR serious impairment in judgment, communication OR inability to function in almost all areas  ADL's:  Intact  Sleep: Poor  Appetite:  Fair  Suicidal Ideation:  Pt denies any thoughts, plans, intent of suicide Homicidal Ideation:  Pt denies any thoughts, plans, intent of homicide  AEB (as evidenced by):per pt report  Mental Status Examination/Evaluation: Objective:  Appearance: Casual  Eye Contact::  Good  Speech:  Pressured  Volume:  Increased  Mood:  Anxious, Euphoric and Irritable  Affect:  Congruent  Thought Process:  Coherent  Orientation:  Full  Thought Content:  Hallucinations: Auditory  Suicidal Thoughts:  No  Homicidal Thoughts:  No  Memory:  Immediate;    Fair Recent;   Fair Remote;   Fair  Judgement:  Impaired  Insight:  Lacking  Psychomotor Activity:  Normal  Concentration:  Fair  Recall:  Fair  Akathisia:  No  Handed:  Right  AIMS (if indicated):     Assets:  Communication Skills Desire for Improvement  Sleep:      Vital Signs:Blood pressure 119/82, pulse 82, temperature 97.5 F (36.4 C), temperature source Oral, resp. rate 18, height 5\' 10"  (1.778 m), weight 81.647 kg (180 lb), SpO2 98.00%. Current Medications: Current Facility-Administered Medications  Medication Dose Route Frequency Provider Last Rate Last Dose  . acetaminophen (TYLENOL) tablet 650 mg  650 mg Oral Q6H PRN Mike Craze, MD      . alum & mag hydroxide-simeth (MAALOX/MYLANTA) 200-200-20 MG/5ML suspension 30 mL  30 mL Oral Q4H PRN Mike Craze, MD      . magnesium hydroxide (MILK OF MAGNESIA) suspension 30 mL  30 mL Oral Daily PRN Mike Craze, MD      . risperiDONE (RISPERDAL) tablet 1 mg  1 mg Oral BID Mike Craze, MD       Facility-Administered Medications Ordered in Other Encounters  Medication Dose Route Frequency Provider Last Rate Last Dose  . DISCONTD: acetaminophen (TYLENOL) tablet 650 mg  650 mg Oral Q4H PRN Trixie Dredge, PA      . DISCONTD: alum & mag hydroxide-simeth (MAALOX/MYLANTA) 200-200-20 MG/5ML suspension 30 mL  30 mL Oral PRN Trixie Dredge, PA      . DISCONTD: doxycycline (VIBRA-TABS) tablet 100 mg  100 mg Oral BID Trixie Dredge, PA   100 mg at 03/11/12 4034  . DISCONTD: ibuprofen (ADVIL,MOTRIN) tablet 600 mg  600 mg Oral Q8H PRN Trixie Dredge,  PA      . DISCONTD: LORazepam (ATIVAN) tablet 1 mg  1 mg Oral Q8H PRN Trixie Dredge, PA      . DISCONTD: nicotine (NICODERM CQ - dosed in mg/24 hours) patch 21 mg  21 mg Transdermal Daily [place], [place]      . DISCONTD: ondansetron (ZOFRAN) tablet 4 mg  4 mg Oral Q8H PRN Trixie Dredge, [place]      . DISCONTD: zolpidem (AMBIEN) tablet 5 mg  5 mg Oral QHS PRN Trixie Dredge, PA        Lab Results:  Results for orders  placed during the hospital encounter of 03/10/12 (from the past 48 hour(s))  ACETAMINOPHEN LEVEL     Status: Normal   Collection Time   03/10/12  4:05 PM      Component Value Range Comment   Acetaminophen (Tylenol), Serum <15.0  10 - 30 ug/mL   CBC     Status: Normal   Collection Time   03/10/12  4:05 PM      Component Value Range Comment   WBC 6.6  4.0 - 10.5 K/uL    RBC 5.30  4.22 - 5.81 MIL/uL    Hemoglobin 15.9  13.0 - 17.0 g/dL    HCT 16.1  09.6 - 04.5 %    MCV 87.2  78.0 - 100.0 fL    MCH 30.0  26.0 - 34.0 pg    MCHC 34.4  30.0 - 36.0 g/dL    RDW 40.9  81.1 - 91.4 %    Platelets 213  150 - 400 K/uL   COMPREHENSIVE METABOLIC PANEL     Status: Normal   Collection Time   03/10/12  4:05 PM      Component Value Range Comment   Sodium 140  135 - 145 mEq/L    Potassium 3.5  3.5 - 5.1 mEq/L    Chloride 102  96 - 112 mEq/L    CO2 29  19 - 32 mEq/L    Glucose, Bld 99  70 - 99 mg/dL    BUN 10  6 - 23 mg/dL    Creatinine, Ser 7.82  0.50 - 1.35 mg/dL    Calcium 9.7  8.4 - 95.6 mg/dL    Total Protein 7.2  6.0 - 8.3 g/dL    Albumin 4.3  3.5 - 5.2 g/dL    AST 15  0 - 37 U/L    ALT 18  0 - 53 U/L    Alkaline Phosphatase 79  39 - 117 U/L    Total Bilirubin 0.5  0.3 - 1.2 mg/dL    GFR calc non Af Amer >90  >90 mL/min    GFR calc Af Amer >90  >90 mL/min   ETHANOL     Status: Normal   Collection Time   03/10/12  4:05 PM      Component Value Range Comment   Alcohol, Ethyl (B) <11  0 - 11 mg/dL   SALICYLATE LEVEL     Status: Abnormal   Collection Time   03/10/12  4:05 PM      Component Value Range Comment   Salicylate Lvl <2.0 (*) 2.8 - 20.0 mg/dL   TSH     Status: Normal   Collection Time   03/10/12  4:05 PM      Component Value Range Comment   TSH 0.914  0.350 - 4.500 uIU/mL   URINE RAPID DRUG SCREEN (HOSP PERFORMED)     Status: Normal  Collection Time   03/10/12  4:41 PM      Component Value Range Comment   Opiates NONE DETECTED  NONE DETECTED    Cocaine NONE DETECTED  NONE  DETECTED    Benzodiazepines NONE DETECTED  NONE DETECTED    Amphetamines NONE DETECTED  NONE DETECTED    Tetrahydrocannabinol NONE DETECTED  NONE DETECTED    Barbiturates NONE DETECTED  NONE DETECTED     Physical Findings: AIMS: Facial and Oral Movements Muscles of Facial Expression: None, normal Lips and Perioral Area: None, normal Jaw: None, normal Tongue: None, normal,Extremity Movements Upper (arms, wrists, hands, fingers): None, normal Lower (legs, knees, ankles, toes): None, normal, Trunk Movements Neck, shoulders, hips: None, normal, Overall Severity Severity of abnormal movements (highest score from questions above): None, normal Incapacitation due to abnormal movements: None, normal Patient's awareness of abnormal movements (rate only patient's report): No Awareness, Dental Status Current problems with teeth and/or dentures?: No Does patient usually wear dentures?: No  CIWA:    COWS:     Risk: Risk of harm to self is elevated by bipolar disorder and use of abusable substances.  Risk of harm to others is minimal in that he has not been involved in fights or had any legal charges filed on him.  Treatment Plan Summary: Daily contact with patient to assess and evaluate symptoms and progress in treatment Medication management Mood/anxiety less than 3/10 where the scale is 1 is the best and 10 is the worst  Plan: Admit, start Risperdal for bipolar disorder.  Discussed the risks, benefits, and probable clinical course with and without treatment.  Pt is agreeable to the current course of treatment. We will continue on q. 15 checks the unit protocol. At this time there is no clinical indication for one-to-one observation as patient contract for safety and presents little risk to harm themself and others.  We will increase collateral information. I encourage patient to participate in group milieu therapy. Pt will be seen in treatment team soon for further treatment and  appropriate discharge planning. Please see history and physical note for more detailed information ELOS: 3 to 5 days.   Jacquel Redditt 03/11/2012, 6:30 PM

## 2012-03-11 NOTE — Progress Notes (Signed)
[facility] MD Progress Note  03/11/2012 5:05 PM  Diagnosis:   Axis I: Bipolar, Manic Axis II: Deferred Axis III:  Past Medical History  Diagnosis Date  . Asthma    Axis IV: other psychosocial or environmental problems Axis V: 21-30 behavior considerably influenced by delusions or hallucinations OR serious impairment in judgment, communication OR inability to function in almost all areas  ADL's:  Intact  Sleep: Poor  Appetite:  Fair  Suicidal Ideation:  Pt denies any thoughts, plans, intent of suicide Homicidal Ideation:  Pt denies any thoughts, plans, intent of homicide  AEB (as evidenced by):per pt report  Mental Status Examination/Evaluation: Objective:  Appearance: Casual  Eye Contact::  Good  Speech:  Pressured  Volume:  Increased  Mood:  Anxious, Euphoric and Irritable  Affect:  Congruent  Thought Process:  Coherent  Orientation:  Full  Thought Content:  Hallucinations: Auditory  Suicidal Thoughts:  No  Homicidal Thoughts:  No  Memory:  Immediate;   Fair Recent;   Fair Remote;   Fair  Judgement:  Impaired  Insight:  Lacking  Psychomotor Activity:  Normal  Concentration:  Fair  Recall:  Fair  Akathisia:  No  Handed:  Right  AIMS (if indicated):     Assets:  Communication Skills Desire for Improvement  Sleep:      Vital Signs:Blood pressure 119/82, pulse 82, temperature 97.5 F (36.4 C), temperature source Oral, resp. rate 18, height 5\' 10"  (1.778 m), weight 81.647 kg (180 lb), SpO2 98.00%. Current Medications: Current Facility-Administered Medications  Medication Dose Route Frequency Provider Last Rate Last Dose  . acetaminophen (TYLENOL) tablet 650 mg  650 mg Oral Q6H PRN Mike Craze, MD      . alum & mag hydroxide-simeth (MAALOX/MYLANTA) 200-200-20 MG/5ML suspension 30 mL  30 mL Oral Q4H PRN Mike Craze, MD      . magnesium hydroxide (MILK OF MAGNESIA) suspension 30 mL  30 mL Oral Daily PRN Mike Craze, MD      . risperiDONE (RISPERDAL) tablet 1  mg  1 mg Oral BID Mike Craze, MD       Facility-Administered Medications Ordered in Other Encounters  Medication Dose Route Frequency Provider Last Rate Last Dose  . DISCONTD: acetaminophen (TYLENOL) tablet 650 mg  650 mg Oral Q4H PRN Trixie Dredge, PA      . DISCONTD: alum & mag hydroxide-simeth (MAALOX/MYLANTA) 200-200-20 MG/5ML suspension 30 mL  30 mL Oral PRN Trixie Dredge, PA      . DISCONTD: doxycycline (VIBRA-TABS) tablet 100 mg  100 mg Oral BID Trixie Dredge, PA   100 mg at 03/11/12 1610  . DISCONTD: ibuprofen (ADVIL,MOTRIN) tablet 600 mg  600 mg Oral Q8H PRN Trixie Dredge, [place]      . DISCONTD: LORazepam (ATIVAN) tablet 1 mg  1 mg Oral Q8H PRN Trixie Dredge, [place]      . DISCONTD: nicotine (NICODERM CQ - dosed in mg/24 hours) patch 21 mg  21 mg Transdermal Daily [place], [place]      . DISCONTD: ondansetron (ZOFRAN) tablet 4 mg  4 mg Oral Q8H PRN Trixie Dredge, [place]      . DISCONTD: zolpidem (AMBIEN) tablet 5 mg  5 mg Oral QHS PRN Trixie Dredge, PA        Lab Results:  Results for orders placed during the hospital encounter of 03/10/12 (from the past 48 hour(s))  ACETAMINOPHEN LEVEL     Status: Normal   Collection Time   03/10/12  4:05 PM      Component Value Range Comment   Acetaminophen (Tylenol), Serum <15.0  10 - 30 ug/mL   CBC     Status: Normal   Collection Time   03/10/12  4:05 PM      Component Value Range Comment   WBC 6.6  4.0 - 10.5 K/uL    RBC 5.30  4.22 - 5.81 MIL/uL    Hemoglobin 15.9  13.0 - 17.0 g/dL    HCT 16.1  09.6 - 04.5 %    MCV 87.2  78.0 - 100.0 fL    MCH 30.0  26.0 - 34.0 pg    MCHC 34.4  30.0 - 36.0 g/dL    RDW 40.9  81.1 - 91.4 %    Platelets 213  150 - 400 K/uL   COMPREHENSIVE METABOLIC PANEL     Status: Normal   Collection Time   03/10/12  4:05 PM      Component Value Range Comment   Sodium 140  135 - 145 mEq/L    Potassium 3.5  3.5 - 5.1 mEq/L    Chloride 102  96 - 112 mEq/L    CO2 29  19 - 32 mEq/L    Glucose, Bld 99  70 - 99 mg/dL    BUN 10  6 - 23 mg/dL     Creatinine, Ser 7.82  0.50 - 1.35 mg/dL    Calcium 9.7  8.4 - 95.6 mg/dL    Total Protein 7.2  6.0 - 8.3 g/dL    Albumin 4.3  3.5 - 5.2 g/dL    AST 15  0 - 37 U/L    ALT 18  0 - 53 U/L    Alkaline Phosphatase 79  39 - 117 U/L    Total Bilirubin 0.5  0.3 - 1.2 mg/dL    GFR calc non Af Amer >90  >90 mL/min    GFR calc Af Amer >90  >90 mL/min   ETHANOL     Status: Normal   Collection Time   03/10/12  4:05 PM      Component Value Range Comment   Alcohol, Ethyl (B) <11  0 - 11 mg/dL   SALICYLATE LEVEL     Status: Abnormal   Collection Time   03/10/12  4:05 PM      Component Value Range Comment   Salicylate Lvl <2.0 (*) 2.8 - 20.0 mg/dL   TSH     Status: Normal   Collection Time   03/10/12  4:05 PM      Component Value Range Comment   TSH 0.914  0.350 - 4.500 uIU/mL   URINE RAPID DRUG SCREEN (HOSP PERFORMED)     Status: Normal   Collection Time   03/10/12  4:41 PM      Component Value Range Comment   Opiates NONE DETECTED  NONE DETECTED    Cocaine NONE DETECTED  NONE DETECTED    Benzodiazepines NONE DETECTED  NONE DETECTED    Amphetamines NONE DETECTED  NONE DETECTED    Tetrahydrocannabinol NONE DETECTED  NONE DETECTED    Barbiturates NONE DETECTED  NONE DETECTED     Physical Findings: AIMS: Facial and Oral Movements Muscles of Facial Expression: None, normal Lips and Perioral Area: None, normal Jaw: None, normal Tongue: None, normal,Extremity Movements Upper (arms, wrists, hands, fingers): None, normal Lower (legs, knees, ankles, toes): None, normal, Trunk Movements Neck, shoulders, hips: None, normal, Overall Severity Severity of abnormal movements (highest score from questions above):  None, normal Incapacitation due to abnormal movements: None, normal Patient's awareness of abnormal movements (rate only patient's report): No Awareness, Dental Status Current problems with teeth and/or dentures?: No Does patient usually wear dentures?: No  CIWA:    COWS:     Treatment  Plan Summary: Daily contact with patient to assess and evaluate symptoms and progress in treatment Medication management Mood/anxiety less than 3/10 where the scale is 1 is the best and 10 is the worst  Plan: Admit, start Risperdal for bipolar disorder.  Discussed the risks, benefits, and probable clinical course with and without treatment.  Pt is agreeable to the current course of treatment.  Anakaren Campion 03/11/2012, 5:05 PM

## 2012-03-11 NOTE — Progress Notes (Addendum)
D: Patient in hallway on approach.  Patient pleasant and cooperative.  Patient smile when speaking and initiates conversations with the Clinical research associate.  Patient is fidgety and anxious while speaking with Clinical research associate.  Patient states he has been dealing with racing thoughts.  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  Patient states he has noticed he has not been feeling well ever since his grandfather died.  Patient states he OD on alcohol and he almost died  Patient states he was in a coma and on a breathing machine for 6 hours.  Patient states he does not know how to drink.  Patient states he was drinking alcohol to slow down his rapid thinking.  Patient states he had a big secret that was bothering him and that contributed to his rapid thoughts.  Patient states he is dating a woman who is older.  Patient states the woman he is dating is 19 years old which is older than his 19 year old mother.  Patient states that the worst of it is that the woman is his best friends mother.  Patient laughs while giving this information.  Patient denies SI/HI and denies AVH.  A: Staff to monitor Q 15 mins for safety.  Encouragement and support offered.  No medications administered by Clinical research associate. R: Patient remains safe on the unit.  Patient attended karaoke group tonight.  Patient states he enjoyed Retail banker and states he sang two songs.  Patient calm, cooperative and visible on the unit.

## 2012-03-11 NOTE — ED Notes (Signed)
Report called to [facility] RN took report.  BH will call us back to let us know when pt. Can be transported.

## 2012-03-11 NOTE — Progress Notes (Addendum)
Patient admitted voluntarily from [facility] ED. The patient stated that he came to [facility] because his "psychiatrist recommended him" due to his behavior lately. The patient states that lately he has "racing thoughts" and that he has "trouble focusing." The patient also states that he feels anxious and stressed due to the death of his grandfather two years ago and because he recently had trouble with his studies at [employer]. The patient is currently denying SI/HI and auditory and visual hallucinations (although the patient did reveal that he had visual hallucinations at [facility] but currently denies any). Will continue to monitor patient for safety.

## 2012-03-11 NOTE — BH Assessment (Signed)
[facility] Assessment Progress Note      Pt accepted to [facility] by Donell Sievert PA assigned to Dr. Orson Aloe. Pt's bed assignment is 504-2. All Appropriate support documentation complete. Consulted with EDP Dr. Jeraldine Loots who is agreeable to put in pt transfer to [facility]. Pt's nurse Victorino Dike will call report prior to pt transfer. Pt currently awaiting transfer.

## 2012-03-11 NOTE — ED Notes (Signed)
Night ACT team member stated that pt. Has been accepted to [facility], [address].  No day ACT team available to complete support paperwork to send pt. To BH.

## 2012-03-11 NOTE — Tx Team (Signed)
Initial Interdisciplinary Treatment Plan  PATIENT STRENGTHS: (choose at least two) Ability for insight Communication skills General fund of knowledge  PATIENT STRESSORS: Educational concerns Marital or family conflict   PROBLEM LIST: Problem List/Patient Goals Date to be addressed Date deferred Reason deferred Estimated date of resolution  Depression      Anxiety                                                 DISCHARGE CRITERIA:  Improved stabilization in mood, thinking, and/or behavior Motivation to continue treatment in a less acute level of care Need for constant or close observation no longer present  PRELIMINARY DISCHARGE PLAN: Attend aftercare/continuing care group Outpatient therapy  PATIENT/FAMIILY INVOLVEMENT: This treatment plan has been presented to and reviewed with the patient, Louis Woodward, and/or family member, .  The patient and family have been given the opportunity to ask questions and make suggestions.  Noah Charon 03/11/2012, 12:36 PM

## 2012-03-12 MED ORDER — RISPERIDONE 1 MG PO TABS
1.0000 mg | ORAL_TABLET | ORAL | Status: AC
  Administered 2012-03-12: 1 mg via ORAL
  Filled 2012-03-12: qty 1

## 2012-03-12 MED ORDER — DOXYCYCLINE HYCLATE 100 MG PO TABS
100.0000 mg | ORAL_TABLET | Freq: Every day | ORAL | Status: DC
  Administered 2012-03-12 – 2012-03-16 (×5): 100 mg via ORAL
  Filled 2012-03-12 (×7): qty 1

## 2012-03-12 MED ORDER — RISPERIDONE 2 MG PO TABS
2.0000 mg | ORAL_TABLET | Freq: Two times a day (BID) | ORAL | Status: DC
  Administered 2012-03-12 – 2012-03-15 (×6): 2 mg via ORAL
  Filled 2012-03-12 (×8): qty 1

## 2012-03-12 NOTE — Progress Notes (Signed)
[facility] Group Notes:  (Counselor/Nursing/MHT/Case Management/Adjunct)  03/12/2012  2:30  PM  Type of Therapy: Group Therapy   Participation Level: Minimal   Participation Quality: Limited  Affect: Depressed  Cognitive: oriented, alert   Insight: Limited  Engagement in Group: Limited  Modes of Intervention: Clarification, Education, Problem-solving, Socialization, Activity, Encouragement and Support   Summary of Progress/Problems: Pt participated in group by listening and self disclosing.  After a brief check-in, therapist introduced the topic of Feelings Around Relapse. Therapist asked Patients to identify:  Which emotions come before and after relapse; 2.  Which emotions are the most powerful;  Therapist emphasized the importance of and benefit to recovery of learning to ask for help vs trying to maintain a false sense of control.  Therapist prompted patients to practice asking another Pt a question.  3. Which emotions they have related to recovery. Therapist prompted patients to identify their recovery plans and any barriers they perceive to their efforts.  Pt stated that disappointment in himself and feeling down were strong emotions. Pt vowed not to drink alcohol again. Therapist offered support and encouragement.  Some progress noted.  Intervention effective.         Marni Griffon C 03/12/2012  2:30 PM

## 2012-03-12 NOTE — Tx Team (Signed)
Patient seen during during d/c planning group and or treatment team.  He endorses racing thoughts and anxiety.  Patient shared that he drinks heaving and used a fifth to a half-gallon of ETOH when drinking.  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  He is uncertain whether he will return to school or live with parents at discharge.  He will be scheduled for outpatient medication management and referred for SA counseling.  Patient denies SI/HI.  He rates anxiety at five and depression at two three.

## 2012-03-12 NOTE — H&P (Addendum)
Medical/psychiatric screening examination/treatment/procedure(s) were performed by non-physician practitioner and as supervising physician I was immediately available for consultation/collaboration.  I have seen and examined this patient and agree with the major elements of this evaluation.  Mood still quite manic, will push Risperdal higher.

## 2012-03-12 NOTE — H&P (Signed)
Psychiatric Admission Assessment Adult  Patient Identification:  Louis Woodward  Date of Evaluation:  03/12/2012  Chief Complaint:  Mood Disorder NOS  History of Present Illness: This is an 21 year old Caucasian male, admitted to [facility] from the [facility] ED with reports of hyper-insomnia, Binge drinking and manic episodes. Patient reports, "My mother took me to the hospital because I asked her to do just that. I have been having high energy levels, shakiness, anxiousness and panic mode whereby I feel like I can't breath. At other times, I may feel irritable. This has been going on for a while. 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 While I was here in [place] living with my parents and attending school, I was always wide open. I am constantly doing stuff, I mean good stuff. I am never tired. But in college, things are different. I found out that I cannot be wide open any more. I needed to slow down, stay focus and study. That was not happening for me. I also has not bee sleeping for a while. Even though I was not sleeping, I never felt tired and or sluggish. Last weekend, my school was playing [employer] [employer]. All my friends were going to be coming home for this game, so I decided to come home too. On Friday night, I hung out with my friends. They were drinking, I drank with them. I ended up overdosing on alcohol. I was completed out and wasted. I was taken to the hospital were I was in a life support x 6 hours. I spooked my mother, and she was not happy with me. Later, I saw my psychiatrist Len Blalock. I told him what has been going on with me. He suggested that I should come to the hospital and I agreed. I need to be sleeping at night. But, I don't want to depend on medication for that. I am not depressed. I am a pretty happy person by nature. For the past few days, things has been a little not so together with me. It got o a point where I  thought I was seeing some weird looking dog".  Review of Systems per ED provider: Constitutional: Negative for fever and chills.  HENT: Negative for sore throat.  Respiratory: Negative for cough and shortness of breath.  Gastrointestinal: Negative for nausea, vomiting, abdominal pain and diarrhea.  Neurological: Negative for weakness and numbness.  Psychiatric/Behavioral: Negative for suicidal ideas. The patient is nervous/anxious and is hyperactive.   Mood Symptoms:  Hypomania/Mania, Past 2 Weeks, Poor focus/oncentration  Depression Symptoms:  insomnia, disturbed sleep,  (Hypo) Manic Symptoms:  Distractibility, Elevated Mood, Flight of Ideas, Licensed conveyancer, Impulsivity,  Anxiety Symptoms:  Excessive Worry,  Psychotic Symptoms:  Hallucinations: None  PTSD Symptoms: Had a traumatic exposure:  None  Past Psychiatric History: Diagnosis: Bipolar, manic phase  Hospitalizations: [facility]  Outpatient Care: Dr. Toni Arthurs  Substance Abuse Care: None reported  Self-Mutilation: None reported  Suicidal Attempts: Denies thoughts and or attempts.  Violent Behaviors: None reported   Past Medical History:   Past Medical History  Diagnosis Date  . Asthma    Allergies:   Allergies  Allergen Reactions  . Cefazolin Rash   PTA Medications: Prescriptions prior to admission  Medication Sig Dispense Refill  . doxycycline (VIBRA-TABS) 100 MG tablet Take 100 mg by mouth daily.         Substance Abuse History in the last 12 months:  Substance Age of 1st Use Last Use Amount Specific Type  Nicotine Denies use     Alcohol 20 Last friday "I drank quite much" Vodka  Cannabis Denies use     Opiates Denies use     Cocaine Denies use     Methamphetamines Denies use     LSD Denies use     Ecstasy Denies use     Benzodiazepines Denies use     Caffeine      Inhalants      Others:                         Consequences of Substance Abuse: Medical Consequences:  Liver damage,  Possible death by overdose Legal Consequences:  Arrests, jail time, Loss of driving privilege. Family Consequences:  Family discord, divorce and or separation.   Social History: Current Place of Residence: [place], [place]    Place of Birth: [place]    Family Members: "My parents and siblings"  Marital Status:  Single  Children: 0  Sons: 0  Daughters: 0  Relationships: "Single, but I have a girl-friend"  Education:  Regulatory affairs officer Problems/Performance: None reported  Religious Beliefs/Practices: None reported  History of Abuse (Emotional/Phsycial/Sexual): None reported  Occupational Experiences: college Armed forces logistics/support/administrative officer History:  None.  Legal History: None reported  Hobbies/Interests: None reported  Family History:  History reviewed. No pertinent family history.  Mental Status Examination/Evaluation: Objective:  Appearance: Casual  Eye Contact::  Good  Speech:  Clear and Coherent  Volume:  Normal  Mood:  Euphoric  Affect:  Appropriate  Thought Process:  Coherent and Intact  Orientation:  Full  Thought Content:  Denies hallucination, delusions and or paranoia.  Suicidal Thoughts:  No  Homicidal Thoughts:  No  Memory:  Immediate;   Good Recent;   Good Remote;   Good  Judgement:  Impaired  Insight:  Fair  Psychomotor Activity:  Normal  Concentration:  Good  Recall:  Good  Akathisia:  No  Handed:  Right  AIMS (if indicated):     Assets:  Desire for Improvement  Sleep:  Number of Hours: 3.75     Laboratory/X-Ray: None Psychological Evaluation(s)      Assessment:    AXIS I:  Bipolar, Manic AXIS II:  Deferred AXIS III:   Past Medical History  Diagnosis Date  . Asthma    AXIS IV:  educational problems, other psychosocial or environmental problems and problems related to social environment AXIS V:  41-50 serious symptoms  Treatment Plan/Recommendations: Admit for safety and stabilization. Review and reinstate any pertinent home  medications for other medical issues. Continue with current treatment plan already in progress. Group counseling sessions and activities.  Treatment Plan Summary: Daily contact with patient to assess and evaluate symptoms and progress in treatment Medication management  Current Medications:  Current Facility-Administered Medications  Medication Dose Route Frequency Provider Last Rate Last Dose  . acetaminophen (TYLENOL) tablet 650 mg  650 mg Oral Q6H PRN Mike Craze, MD      . alum & mag hydroxide-simeth (MAALOX/MYLANTA) 200-200-20 MG/5ML suspension 30 mL  30 mL Oral Q4H PRN Mike Craze, MD      . magnesium hydroxide (MILK OF MAGNESIA) suspension 30 mL  30 mL Oral Daily PRN Mike Craze, MD      . risperiDONE (RISPERDAL) tablet 1 mg  1 mg Oral NOW Mike Craze, MD   1 mg at 03/12/12 1032  .  risperiDONE (RISPERDAL) tablet 2 mg  2 mg Oral BID Mike Craze, MD      . DISCONTD: risperiDONE (RISPERDAL) tablet 1 mg  1 mg Oral BID Mike Craze, MD   1 mg at 03/12/12 0746    Observation Level/Precautions:  Q 15 minute checks for safety  Laboratory:  Per ED report, (+) alcohol level of 289.  Psychotherapy:  Group sessions  Medications:  See medication lists  Routine PRN Medications:  Yes  Consultations:  None indicated at this time  Discharge Concerns:  Safety  Other:     Armandina Stammer I 10/4/201311:29 AM

## 2012-03-12 NOTE — Tx Team (Signed)
Interdisciplinary Treatment Plan Update (Adult)  Date:  03/12/2012  Time Reviewed:  9:50 AM   Progress in Treatment: Attending groups:   Yes   Participating in groups:  Yes Taking medication as prescribed:  Yes Tolerating medication:  Yes Family/Significant othe contact made: Patient consents for contact with mother Patient understands diagnosis:  Yes Discussing patient identified problems/goals with staff: Yes Medical problems stabilized or resolved: Yes Denies suicidal/homicidal ideation:Yes Issues/concerns per patient self-inventory:  Other:  New problem(s) identified:  Reason for Continuation of Hospitalization: Anxiety Depression Medication stabilization  Interventions implemented related to continuation of hospitalization:  Medication Management; safety checks q 15 mins  Additional comments:  Estimated length of stay: 2-3 days  Discharge Plan: Home with outpatient follow up  New goal(s):  Review of initial/current patient goals per problem list:    1.  Goal(s):  Reduce anxiety (rated at five today)   Met:  No  Target date: d/c  As evidenced by:  Patient currently rating symptoms at four or below    2.  Goal (s): .stabilize on meds   Met:  No  Target date:  d/c  As evidenced by: Patient reports being stabilized on medications - less symptomatic    3.  Goal(s):  Address problems with alcohol use  Met:  No  Target date: d/c  As evidenced by: Patient will be made aware of problems associated with alcohol and high tolerance.  He will be referred for SA counseling  4.  Goal(s): Refer for outpatient medications management   Met:  No  Target date: d/c  As evidenced by: Follow up will be scheduled with outpatient MD  Attendees: Patient:  Louis Woodward 03/12/2012 9:51 AM  Nursing:  Nestor Ramp, RN 03/12/2012 9:53 AM  Physician:  Orson Aloe, MD 03/12/2012 9:50 AM   Nursing:   Berneice Heinrich, RN 03/12/2012 9:50 AM   CaseManager:  Juline Patch, LCSW  03/12/2012 9:50 AM   Counselor:  Marni Griffon, [facility] 03/12/2012 9:50 AM   Other:  Foye Clock, MSW Intern    03/12/2012 9:54 AM Other:  Lillia Corporal, Nursing Student    03/12/2012 9:54 AM Other:  Edwena Blow, Nursing Student    03/12/2012 9:55 AM

## 2012-03-12 NOTE — Progress Notes (Signed)
[facility] Adult Inpatient Family/Significant Other Suicide Prevention Education  Suicide Prevention Education:  Education Completed; Fender Herder, Mother, [phone], has been identified by the patient as the family member/significant other with whom the patient will be residing, and identified as the person(s) who will aid the patient in the event of a mental health crisis (suicidal ideations/suicide attempt).  With written consent from the patient, the family member/significant other has been provided the following suicide prevention education, prior to the and/or following the discharge of the patient.  The suicide prevention education provided includes the following:  Suicide risk factors  Suicide prevention and interventions  National Suicide Hotline telephone number  [facility] assessment telephone number  [facility] Emergency Assistance 911  [facility] and/or Residential Mobile Crisis Unit telephone number  Request made of family/significant other to:  Remove weapons (e.g., guns, rifles, knives), all items previously/currently identified as safety concern.    Remove drugs/medications (over-the-counter, prescriptions, illicit drugs), all items previously/currently identified as a safety concern.  The family member/significant other verbalizes understanding of the suicide prevention education information provided.  The family member/significant other agrees to remove the items of safety concern listed above.  Marni Griffon C 03/12/2012, 01:10 PM

## 2012-03-12 NOTE — Progress Notes (Signed)
Psychoeducational Group Note  Date:  03/12/2012 Time:  1100  Group Topic/Focus:  Early Warning Signs:   The focus of this group is to help patients identify signs or symptoms they exhibit before slipping into an unhealthy state or crisis.  Participation Level:  Minimal  Participation Quality:  Appropriate  Affect:  Appropriate  Cognitive:  Appropriate  Insight:  Good  Engagement in Group:  Limited  Additional Comments: Pt participated in early warning signs. Pt identified behavorial, thought, attitude and feelings changes that are in the beginning stages towards relapsing. Pt completed worksheet on warning signs for relapse and unhealthy thought patterns and positive ways to think instead of being negative.   Karleen Hampshire Brittini 03/12/2012, 6:58 PM

## 2012-03-12 NOTE — Progress Notes (Signed)
Child/Adolescent Comprehensive Assessment  Patient ID: Louis Woodward, male   DOB: 02/16/1991, 21 y.o.   MRN: [id number]  Information Source: Information source: Patient  Living Environment/Situation:  Living Arrangements: Parent Living conditions (as described by patient or guardian): Pt lives in a dorm at [employer].  He is a Printmaker.  He lived with his parents and 2 younger brothers in [place] until he left for college in August 2013.  Pt has a girlfriend who is age 52. How long has patient lived in current situation?: Pt lived iwth his parents all of his life.  He reports he had a good relationship with his mother and OK with his father but his father worked so much was gone Theme park manager.  Pt was very close to his grandfather who died from Cancer.  Pt stated he  is still angry and has not grieved.    What is atmosphere in current home: Loving;Supportive;Comfortable  Family of Origin: By whom was/is the patient raised?: Both parents Caregiver's description of current relationship with people who raised him/her: good.   Are caregivers currently alive?: Yes Location of caregiver: [place], [place] Atmosphere of childhood home?: Comfortable;Loving;Supportive Issues from childhood impacting current illness: No  Issues from Childhood Impacting Current Illness:    Siblings: Does patient have siblings?: Yes (2 bros. 16 and 13)                    Marital and Family Relationships: Marital status: Single Does patient have children?: No Has the patient had any miscarriages/abortions?: No (N/A) How has current illness affected the family/family relationships: Concerned - mother had no idea pt had been experiencing racing thoughts, etc. What impact does the family/family relationships have on patient's condition: Supportive family Did patient suffer any verbal/emotional/physical/sexual abuse as a child?: No Did patient suffer from severe childhood neglect?: No Was the patient ever a victim  of a crime or a disaster?: No Has patient ever witnessed others being harmed or victimized?: No  Social Support System: Forensic psychologist System: None  Leisure/Recreation: Leisure and Hobbies: Outdoor activities, running, working out, working, Neurosurgeon.  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  Pt likes to cook and bake.   Family Assessment: Was significant other/family member interviewed?: Yes Is significant other/family member supportive?: Yes Did significant other/family member express concerns for the patient: Yes If yes, brief description of statements: Concerned - willing to do anything to help Is significant other/family member willing to be part of treatment plan: Yes Describe significant other/family member's perception of patient's illness: Mother had no idea Pt was experiencing s/s until he informed her this past weekend.  Pt stated he is not able to sit still, he has racing thoughts, some delusions, inability to concentrate, "going 100 miles an [place] Describe significant other/family member's perception of expectations with treatment: Parents willing to get whatever help Pt needs at the most reasonable cost.  Mother would like to talk with the MD.    Spiritual Assessment and Cultural Influences: Type of faith/religion: Methodist Patient is currently attending church: Yes Name of church: [employer] Pastor/Rabbi's name: Optician, dispensing, Audiological scientist  Education Status: Is patient currently in school?: Yes Current Grade: Freshman at [employer] grade of school patient has completed: 12 Name of school: Currently at [employer] person: Mother will contact college.  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  Pt is a  fast, and Chief Executive Officer.   Legal History (Arrests, DWI;s, Probation/Parole, Pending Charges): History of arrests?: No Patient is currently on probation/parole?: No Has alcohol/substance abuse ever caused legal problems?: No  High Risk Psychosocial Issues Requiring Early Treatment Planning and Intervention: Issue #1: Racing thoughts, delusions Intervention(s) for issue #1: Therapist will encourage Pt to walk and exercise at recreation time. Therapist will encourage Pt to engage in therapy to express his feelings.    Integrated Summary. Recommendations, and Anticipated Outcomes: Summary: Pt is an 68 yr. old freshman at [employer].  Pt's mother took him to a psychiatrist for the 1st time due to racing thoughts, some delusion, inability to sit still or concentrate.  Dr. Toni Arthurs referred Pt to [facility].  Pt has never had therapy of psychiatric tx in the past.  Pt was very close to his grandfather who died this year and reports he is still angry that he died.  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  He stated that he  started drinking in July for the first time and drank a gallon of liquor which landed him in ICU.  Pt  stated he  does not plan to drink again. Pt's mother want's to  Recommendations: Patient's mother wants to talk ot MD.  Recommend:  Crisis Stabilization, psych eval., medication mgt., group therapy, psych/edu group to teach coping skills, and case mgt. Anticipated Outcomes: Slower thought Process,, Pt needs referral for Pschiatrist and therapist.  Location depending on whether Pt will return to college or stay in [place].  Pt wants to go to Hospice for grief counseling.   Identified Problems: Does patient have access to transportation?: Yes Does patient have financial barriers related to discharge medications?: Yes Patient description of barriers related to discharge medications: Needs to obtain services and medications that insurance will cover 100% or free services.  Risk to  Self: Suicidal Ideation: No-Not Currently/Within Last 6 Months Is patient at risk for suicide?: Yes (High Risk) What has been your use of drugs/alcohol within the last 12 months?: Bing drinking large quantities of alcohol. ID;d ib 1 gallon of liquor.  Can drink  fifth of alcohol at a sitting   Pt has a very high tolerance. How many times?:  (several this summer.) Other Self Harm Risks: None Intentional Self Injurious Behavior: None  Risk to Others: Homicidal Ideation: No Thoughts of Harm to Others: No Current Homicidal Intent: No Current Homicidal Plan: No Assessment of Violence: None Noted Does patient have access to weapons?: No Does patient have a court date: No  Family History of Physical and Psychiatric Disorders: Does family history include significant physical illness?:  (Grandfather had cancer) Does family history includes significant psychiatric illness?: Yes Psychiatric Illness Description:: Bipolar Depression Does family history include substance abuse?: Yes Substance Abuse Description:: Alcoholism  History of Drug and Alcohol Use: Does patient have a history of alcohol use?: Yes Alcohol Use Description:: Binge drinks - began this summer.  Pt has very high tolerance. Does patient have a history of drug use?: No Does patient experience withdrawal symtoms when discontinuing use?: No Does patient have a history of intravenous drug use?: No  History of Previous Treatment or Community Mental Health Resources Used: History of previous treatment or community mental health resources used:: Outpatient treatment Outcome of previous treatment: Pt saw Dr. Toni Arthurs this  week for evaluation and was referred to [facility].  Pt has not had prior treatment of any kind.  Christen Butter, 03/12/2012

## 2012-03-12 NOTE — Progress Notes (Signed)
D: Patient denies SI. Patient has an appropriate mood and affect. The patient didn't rate his depression and hopelessness (said he was "feeling great"). The patient reports sleeping fairly well and states that his appetite and energy level are good. The patient makes grandiose statements such as "I'm doing great", "I'm superman", and "I can do anything" throughout the day. The patient is attending groups and is interacting appropriately within the milieu.  A: Patient given emotional support from RN. Patient encouraged to come to staff with concerns and/or questions. Patient's medication routine continued. Patient's orders and plan of care reviewed.  R: Patient remains appropriate and cooperative. Will continue to monitor patient q15 minutes for safety.

## 2012-03-12 NOTE — ED Provider Notes (Signed)
Medical screening examination/treatment/procedure(s) were performed by non-physician practitioner and as supervising physician I was immediately available for consultation/collaboration.   Dreonna Hussein M Ercilia Bettinger, MD 03/12/12 1806 

## 2012-03-13 NOTE — Progress Notes (Signed)
Patient ID: Louis Woodward, male   DOB: 07/13/1990, 21 y.o.   MRN: [id number]  Pt. attended and participated in aftercare planning group. Pt. accepted information on suicide prevention, warning signs to look for with suicide and crisis line numbers to use. The pt. agreed to call crisis line numbers if having warning signs or having thoughts of suicide. Pt shared that he was feeling pretty good. No S/I or H/I. Pt wanted to know about his discharge date.

## 2012-03-13 NOTE — Progress Notes (Signed)
[facility] Group Notes:  (Counselor/Nursing/MHT/Case Management/Adjunct)  03/13/2012 12:10 AM  Type of Therapy:  Psychoeducational Skills  Participation Level:  Active  Participation Quality:  Attentive  Affect:  Anxious  Cognitive:  Appropriate  Insight:  Limited  Engagement in Group:  Limited  Engagement in Therapy:  Limited  Modes of Intervention:  Education  Summary of Progress/Problems: The patient verbalized that he had a good day since he learned a great deal from the groups. His goal for tomorrow is to get more rest.    Louis Woodward S 03/13/2012, 12:10 AM

## 2012-03-13 NOTE — Progress Notes (Signed)
Patient ID: Louis Woodward, male   DOB: April 27, 1991, 21 y.o.   MRN: [id number]  Problem: Alcohol Dependence, Bipolar Mania  D: Patient interacting well with peers, and with animated affect.   A: Monitor patient Q 15 minutes for safety, encourage staff/peer interaction, administer medications as ordered by MD.  R: Patient compliant with HS medications, and participated well in group session. Patient denies SI or plans to harm himself at th is time.

## 2012-03-13 NOTE — Progress Notes (Signed)
D- Louis Woodward is out in milieu interacting with peers and attending groups. He appeared to make a connection between stopping long distance running and increasing "mania".  Also stated that he is now "tired" and has never felt this way before. Denies depression and hopelessness. Denies SI/HI.  No other physical complaints and no prn medications requested.  A- Support and encouragement given.  Medication information given and patient verbalizes understanding. Current POC implemented and goals evaluated. 15' checks continued. R- Receptive to group topic this am. Behavior is appropriate and remains safe on unit.

## 2012-03-13 NOTE — Progress Notes (Signed)
Patient ID: Louis Woodward, male   DOB: 06/25/1990, 21 y.o.   MRN: [id number]   [facility] Group Notes:  (Counselor/Nursing/MHT/Case Management/Adjunct)  03/13/2012 1:15 PM  Type of Therapy:  Group Therapy, Dance/Movement Therapy   Participation Level:  Active  Participation Quality:  Appropriate, Sharing and Supportive  Affect:  Appropriate  Cognitive:  Appropriate  Insight:  Good  Engagement in Group:  Good  Engagement in Therapy:  Good  Modes of Intervention:  Clarification, Problem-solving, Role-play, Socialization and Support  Summary of Progress/Problems:  Therapist and group members discussed healthy coping skills such as exercising, deep breathing, and spirituality. Therapist modeled when our coping skills do not work and group members discussed asking for help and being able to accept help. Pt offered support and insight and other group members and stated, "we're worth it," "just try, just listen" implying that believes in his treatment and wants to make positive changes in his life. Pt is realizing self-worth.     Cassidi Long 03/13/2012. 3:35 PM

## 2012-03-13 NOTE — Progress Notes (Signed)
Patient ID: Louis Woodward, male   DOB: [birth year], 21 y.o.   MRN: [id number] [facility] MD Progress Note  03/13/2012 3:39 PM   Mood better. No new acute issues. Thinks groups are helping these days. Diagnosis:   Axis I: Bipolar, Manic Axis II: Deferred Axis III:  Past Medical History  Diagnosis Date  . Asthma    Axis IV: other psychosocial or environmental problems Axis V: 21-30 behavior considerably influenced by delusions or hallucinations OR serious impairment in judgment, communication OR inability to function in almost all areas  ADL's:  Intact  Sleep: better  Appetite:  Fair  Suicidal Ideation:  Pt denies any thoughts, plans, intent of suicide Homicidal Ideation:  Pt denies any thoughts, plans, intent of homicide  AEB (as evidenced by):per pt report  Mental Status Examination/Evaluation: Objective:  Appearance: Casual  Eye Contact::  Good  Speech:  Pressured  Volume:  Increased  Mood:  Anxious, Euphoric and Irritable  Affect:  Congruent  Thought Process:  Coherent  Orientation:  Full  Thought Content:  Hallucinations: Auditory  Suicidal Thoughts:  No  Homicidal Thoughts:  No  Memory:  Immediate;   Fair Recent;   Fair Remote;   Fair  Judgement:  Impaired  Insight:  Lacking  Psychomotor Activity:  Normal  Concentration:  Fair  Recall:  Fair  Akathisia:  No  Handed:  Right  AIMS (if indicated):     Assets:  Communication Skills Desire for Improvement  Sleep:  Number of Hours: 6.25    Vital Signs:Blood pressure 135/76, pulse 102, temperature 98.2 F (36.8 C), temperature source Oral, resp. rate 16, height 5\' 10"  (1.778 m), weight 81.647 kg (180 lb), SpO2 98.00%. Current Medications: Current Facility-Administered Medications  Medication Dose Route Frequency Provider Last Rate Last Dose  . acetaminophen (TYLENOL) tablet 650 mg  650 mg Oral Q6H PRN Mike Craze, MD      . alum & mag hydroxide-simeth (MAALOX/MYLANTA) 200-200-20 MG/5ML suspension 30 mL  30 mL Oral  Q4H PRN Mike Craze, MD      . doxycycline (VIBRA-TABS) tablet 100 mg  100 mg Oral Daily Sanjuana Kava, NP   100 mg at 03/13/12 0756  . magnesium hydroxide (MILK OF MAGNESIA) suspension 30 mL  30 mL Oral Daily PRN Mike Craze, MD      . risperiDONE (RISPERDAL) tablet 2 mg  2 mg Oral BID Mike Craze, MD   2 mg at 03/13/12 0756    Lab Results:  No results found for this or any previous visit (from the past 48 hour(s)).  Physical Findings: AIMS: Facial and Oral Movements Muscles of Facial Expression: None, normal Lips and Perioral Area: None, normal Jaw: None, normal Tongue: None, normal,Extremity Movements Upper (arms, wrists, hands, fingers): None, normal Lower (legs, knees, ankles, toes): None, normal, Trunk Movements Neck, shoulders, hips: None, normal, Overall Severity Severity of abnormal movements (highest score from questions above): None, normal Incapacitation due to abnormal movements: None, normal Patient's awareness of abnormal movements (rate only patient's report): No Awareness, Dental Status Current problems with teeth and/or dentures?: No Does patient usually wear dentures?: No  CIWA:    COWS:     Treatment Plan Summary: Daily contact with patient to assess and evaluate symptoms and progress in treatment Medication management Mood/anxiety less than 3/10 where the scale is 1 is the best and 10 is the worst  Plan:   Continue current meds  Wonda Cerise 03/13/2012, 3:39 PM

## 2012-03-13 NOTE — Progress Notes (Signed)
Patient ID: Louis Woodward, male   DOB: 1990-11-26, 22 y.o.   MRN: [id number]  Problem: Bipolar, ETOH  D: Patient pleasant and smiling and interacting well with peers on unit.   A: Monitor patient Q 15 minutes for safety, encourage staff/peer interaction, administer medications as ordered by MD.  R: Patient states he is feeling much better since being admitted to unit. Patient compliant with group and meds.

## 2012-03-14 NOTE — Progress Notes (Signed)
Patient ID: Louis Woodward, male   DOB: [birth year], 21 y.o.   MRN: [id number] Patient ID: Louis Woodward, male   DOB: [birth year], 21 y.o.   MRN: [id number] [facility] MD Progress Note  03/14/2012 11:40 AM   Feels ready for discharge now. Good sleep. Plans to start college soon. Mood better. No new acute issues. Thinks groups are helping these days.  Diagnosis:   Axis I: Bipolar, Manic Axis II: Deferred Axis III:  Past Medical History  Diagnosis Date  . Asthma    Axis IV: other psychosocial or environmental problems Axis V: 21-30 behavior considerably influenced by delusions or hallucinations OR serious impairment in judgment, communication OR inability to function in almost all areas  ADL's:  Intact  Sleep: better  Appetite:  Fair  Suicidal Ideation:  Pt denies any thoughts, plans, intent of suicide Homicidal Ideation:  Pt denies any thoughts, plans, intent of homicide  AEB (as evidenced by):per pt report  Mental Status Examination/Evaluation: Objective:  Appearance: Casual  Eye Contact::  Good  Speech:  Pressured  Volume:  Increased  Mood:  Anxious, Euphoric and Irritable  Affect:  Congruent  Thought Process:  Coherent  Orientation:  Full  Thought Content:  Hallucinations: Auditory  Suicidal Thoughts:  No  Homicidal Thoughts:  No  Memory:  Immediate;   Fair Recent;   Fair Remote;   Fair  Judgement:  Impaired  Insight:  Lacking  Psychomotor Activity:  Normal  Concentration:  Fair  Recall:  Fair  Akathisia:  No  Handed:  Right  AIMS (if indicated):     Assets:  Communication Skills Desire for Improvement  Sleep:  Number of Hours: 6.75    Vital Signs:Blood pressure 113/70, pulse 108, temperature 97.8 F (36.6 C), temperature source Oral, resp. rate 18, height 5\' 10"  (1.778 m), weight 81.647 kg (180 lb), SpO2 98.00%. Current Medications: Current Facility-Administered Medications  Medication Dose Route Frequency Provider Last Rate Last Dose  . acetaminophen (TYLENOL)  tablet 650 mg  650 mg Oral Q6H PRN Mike Craze, MD      . alum & mag hydroxide-simeth (MAALOX/MYLANTA) 200-200-20 MG/5ML suspension 30 mL  30 mL Oral Q4H PRN Mike Craze, MD      . doxycycline (VIBRA-TABS) tablet 100 mg  100 mg Oral Daily Sanjuana Kava, NP   100 mg at 03/14/12 0817  . magnesium hydroxide (MILK OF MAGNESIA) suspension 30 mL  30 mL Oral Daily PRN Mike Craze, MD      . risperiDONE (RISPERDAL) tablet 2 mg  2 mg Oral BID Mike Craze, MD   2 mg at 03/14/12 7829    Lab Results:  No results found for this or any previous visit (from the past 48 hour(s)).  Physical Findings: AIMS: Facial and Oral Movements Muscles of Facial Expression: None, normal Lips and Perioral Area: None, normal Jaw: None, normal Tongue: None, normal,Extremity Movements Upper (arms, wrists, hands, fingers): None, normal Lower (legs, knees, ankles, toes): None, normal, Trunk Movements Neck, shoulders, hips: None, normal, Overall Severity Severity of abnormal movements (highest score from questions above): None, normal Incapacitation due to abnormal movements: None, normal Patient's awareness of abnormal movements (rate only patient's report): No Awareness, Dental Status Current problems with teeth and/or dentures?: No Does patient usually wear dentures?: No  CIWA:    COWS:     Treatment Plan Summary: Daily contact with patient to assess and evaluate symptoms and progress in treatment Medication management Mood/anxiety less than 3/10 where the scale is  1 is the best and 10 is the worst  Plan:   Continue current meds  Wonda Cerise 03/14/2012, 11:40 AM

## 2012-03-14 NOTE — Progress Notes (Signed)
Patient ID: Louis Woodward, male   DOB: 1990-08-04, 21 y.o.   MRN: [id number]  Pt. attended and participated in aftercare planning group. Pt. verbally accepted information on suicide prevention, warning signs to look for with suicide and crisis line numbers to use. Pt shared that he was feeling "pretty doggone good" this morning. No S/I or H/I. Pt received the daily workbook, as well as information on support groups and the [facility].

## 2012-03-14 NOTE — Progress Notes (Signed)
Patient ID: Louis Woodward, male   DOB: 01/25/1991, 21 y.o.   MRN: [id number] D: Pt. In bed, eyes closed, resp. Even. A: Pt. Will be monitored q51min for safety. Writer will monitor for s/s of distress/discomfort. R: Pt. Remains safe on the unit. No distress noted.

## 2012-03-14 NOTE — Progress Notes (Signed)
Patient ID: Louis Woodward, male   DOB: [birth year], 21 y.o.   MRN: [id number]   [facility] Group Notes:  (Counselor/Nursing/MHT/Case Management/Adjunct)  03/14/2012 1:15 PM  Type of Therapy:  Group Therapy, Dance/Movement Therapy   Participation Level:  Active  Participation Quality:  Appropriate  Affect:  Appropriate  Cognitive:  Appropriate  Insight:  Good  Engagement in Group:  Good  Engagement in Therapy:  Good  Modes of Intervention:  Clarification, Problem-solving, Role-play, Socialization and Support  Summary of Progress/Problems: Therapist and group members discussed how we know when we need help, appropriate support systems, and changing our perceptions. Group members shared experiences and what they find important in a support. Pt shared that recovery is important to him because he wants to get back to who he used to be. Pt shared that having someone who affirms and validates him is what he needs in a support.      Cassidi Long 03/14/2012. 3:51 PM

## 2012-03-14 NOTE — Progress Notes (Signed)
D- Patient is out in milieu interacting with peers and attending groups with good participation.  Insightful with coping skills and ways to manage his recovery at d/c.  Denies depression and anxiety and states he feels ready for discharge.  Reports good sleep and appetite.  Denies SI and contracts for safety.  A- Positive feedback and support offered.  Ongoing medication education done with good pt. Feedback. Continue current POC and evaluation of goals.  15' checks for safety.  R- Progressing with goals.Compliant with scheduled medications and reamains safe on the unit.

## 2012-03-14 NOTE — Progress Notes (Signed)
[facility] Group Notes:  (Counselor/Nursing/MHT/Case Management/Adjunct)  03/14/2012 12:38 AM  Type of Therapy:  Psychoeducational Skills  Participation Level:  Active  Participation Quality:  Appropriate  Affect:  Appropriate  Cognitive:  Appropriate  Insight:  Good  Engagement in Group:  Good  Engagement in Therapy:  Good  Modes of Intervention:  Education  Summary of Progress/Problems: The patient verbalized in group that he had a very good day. For one, he stated that he worked on his coping skills that he learned from his group. In addition, he felt better today since he rested well last evening. His goals for tomorrow are as follows: get more rest and learn more about his illness.    Hiep Ollis S 03/14/2012, 12:38 AM

## 2012-03-14 NOTE — Progress Notes (Signed)
pts mother came to visit this pm.Pt has been on the phone a lot of the pm talking to different people. Stated he likes [employer] . Pt has been in the mileu most of the pm when not on the phone with the other pts. Appears very calm after receiving his resiperdal and contracts for safety. No SI or HI and remains on 15 minute checks and safe.

## 2012-03-15 DIAGNOSIS — F1994 Other psychoactive substance use, unspecified with psychoactive substance-induced mood disorder: Secondary | ICD-10-CM

## 2012-03-15 DIAGNOSIS — F1020 Alcohol dependence, uncomplicated: Secondary | ICD-10-CM

## 2012-03-15 DIAGNOSIS — F901 Attention-deficit hyperactivity disorder, predominantly hyperactive type: Secondary | ICD-10-CM | POA: Diagnosis present

## 2012-03-15 MED ORDER — BENZTROPINE MESYLATE 1 MG PO TABS
1.0000 mg | ORAL_TABLET | Freq: Once | ORAL | Status: AC
  Administered 2012-03-15: 1 mg via ORAL
  Filled 2012-03-15 (×2): qty 1

## 2012-03-15 MED ORDER — BENZTROPINE MESYLATE 0.5 MG PO TABS
0.5000 mg | ORAL_TABLET | Freq: Two times a day (BID) | ORAL | Status: DC
  Administered 2012-03-15: 0.5 mg via ORAL
  Filled 2012-03-15 (×6): qty 1

## 2012-03-15 MED ORDER — BENZTROPINE MESYLATE 0.5 MG PO TABS
0.5000 mg | ORAL_TABLET | Freq: Two times a day (BID) | ORAL | Status: DC
  Administered 2012-03-15 – 2012-03-16 (×2): 0.5 mg via ORAL
  Filled 2012-03-15 (×7): qty 1

## 2012-03-15 MED ORDER — DIPHENHYDRAMINE HCL 50 MG/ML IJ SOLN: 50.0000 mg | Freq: Four times a day (QID) | INTRAMUSCULAR | Status: DC | PRN

## 2012-03-15 MED ORDER — RISPERIDONE 1 MG PO TABS
1.5000 mg | ORAL_TABLET | Freq: Every day | ORAL | Status: DC
  Filled 2012-03-15: qty 1

## 2012-03-15 MED ORDER — RISPERIDONE 0.5 MG PO TABS
1.5000 mg | ORAL_TABLET | Freq: Two times a day (BID) | ORAL | Status: DC
  Filled 2012-03-15 (×4): qty 3

## 2012-03-15 MED ORDER — ENOXAPARIN SODIUM 40 MG/0.4ML ~~LOC~~ SOLN: 40.0000 mg | SUBCUTANEOUS | Status: DC

## 2012-03-15 MED ORDER — DIPHENHYDRAMINE HCL 25 MG PO CAPS
50.0000 mg | ORAL_CAPSULE | Freq: Four times a day (QID) | ORAL | Status: DC | PRN
  Administered 2012-03-15: 50 mg via ORAL
  Filled 2012-03-15: qty 2

## 2012-03-15 MED ORDER — CLONIDINE HCL ER 0.1 MG PO TB12
0.1000 mg | ORAL_TABLET | ORAL | Status: DC
  Administered 2012-03-15 – 2012-03-16 (×2): 0.1 mg via ORAL
  Filled 2012-03-15 (×6): qty 1

## 2012-03-15 MED ORDER — CLONIDINE HCL ER 0.1 MG PO TB12
0.1000 mg | ORAL_TABLET | ORAL | Status: DC
  Filled 2012-03-15 (×4): qty 1

## 2012-03-15 MED ORDER — RISPERIDONE 0.5 MG PO TABS
0.5000 mg | ORAL_TABLET | Freq: Every day | ORAL | Status: DC
  Filled 2012-03-15: qty 1

## 2012-03-15 NOTE — Progress Notes (Signed)
[facility] Group Notes:  (Counselor/Nursing/MHT/Case Management/Adjunct)  03/15/2012 12:26 AM  Type of Therapy:  Psychoeducational Skills  Participation Level:  Active  Participation Quality:  Attentive  Affect:  Appropriate  Cognitive:  Appropriate  Insight:  Good  Engagement in Group:  Good  Engagement in Therapy:  Good  Modes of Intervention:  Education  Summary of Progress/Problems: The patient verbalized in group this evening that he had a great day because he was able to go outside. In addition, he attributes his good day to the fact that his medication appears to be working. His support system (theme of the day) consists of his mother.    Louis Woodward S 03/15/2012, 12:26 AM

## 2012-03-15 NOTE — Progress Notes (Signed)
[facility] Group Notes:  (Counselor/Nursing/MHT/Case Management/Adjunct)  03/15/2012 3:00 PM  Type of Therapy: Group Therapy   Participation Level: Minimal   Participation Quality: Limited  Affect: Depressed  Cognitive: oriented, alert   Insight: minimal  Engagement in Group: Limited  Modes of Intervention: Clarification, Education, Problem-solving, Socialization, Encouragement and Support   Summary of Progress/Problems: Pt participated in group by listening attentively and self disclosing.  Therapist addressed the whole person concept of Wellness.  Therapist prompted Patients to identify barriers to recovery.  Therapist guided patients to discuss thoughts, feelings, and behaviors related to the barriers.  Therapist challenged patients to identify changes they are motivatied to make in order to overcome their barriers.  Pt stated he had become overwhelmed with many stressors.  He stated he was willing to stop drinking and avoid friends that party too much.  Pt stated he has a lot of family support and will be attending therapy and psychiatric appts. As well as Hospice.  Therapist offered support and encouragement.  Some progress noted.  Intervention effective.         Marni Griffon C 10/7//2013  3:00 PM

## 2012-03-15 NOTE — Progress Notes (Signed)
Psychoeducational Group Note  Date:  03/15/2012 Time:  1100  Group Topic/Focus:  Self Care:   The focus of this group is to help patients understand the importance of self-care in order to improve or restore emotional, physical, spiritual, interpersonal, and financial health.  Participation Level:  Active  Participation Quality:  Appropriate and Attentive  Affect:  Appropriate  Cognitive:  Alert, Appropriate and Oriented  Insight:  Good  Engagement in Group:  Good  Additional Comments:  Pt. Engaged in group and identified areas of strength and areas of growth in regards to self-care  Meredith Staggers 03/15/2012, 2:02 PM

## 2012-03-15 NOTE — Progress Notes (Signed)
[facility] MD Progress Note  03/15/2012 3:09 PM S: "I'm a lot calmer now, closer to 55 mph.  I am sleeping better. ... My tongue is swelling and feels funny. ... It got better with that Cogentin, but it is swelling again".  Pt c/o: right antecubital pain, swelling. Pt states had an IV recently removed from site. Pt also states that he "passes out with needles."   O: Considerable time spent with pt discussing his history of trouble focusing and sitting still all during school as well as some of the history with his girlfriend (age 19) and some of his history with alcohol and his mood dyscontrol.  He seems to be having some EPS with his tongue.  He noted lip twitches as well.  There is smooth excursion of both wrists and elbows.  He reports feeling to sleepy in the AM.  Will cut back on the dose of the Risperdal to 1.5 BID and will add Cogentin 0.5 mg for the tongue.  The first dose worked some, but was not strong enough, will add 1 mg now and then start routine dose.  The counselor who had him in discharge planning meeting noted that he could not sit still.  This suggests the ADHD hyperactive type.  Spoke with pt about his history in school.  He agreed that he had trouble focusing in school.  He was noted to be jogging his leg up and down constantly during the interview, will Order Kapvay for that.  He might benefit from Effexor for the focus problem thereby avoiding a stimulant that would pave the way for him to return to addictive use of something.  Talked to him about having a girlfriend who is actually the mother of his friend.  Specifically suggested to him that I have never noted an occasion where someone almost three times the age of another was a good relationship.  In fact pointed out to him that there are famous cases of older women having relations with younger males and how those have always been situations where the younger person seemed to be taken advantage of and never really were equal partners in  those relationships.  He tended to agree that that relationship was not healthy.  I gave him some coaching on how to separate himself and keep himself separated from this lady.  He seemed to accept that well.  Considerable time spent talking with mother on the phone corroborating his history.  She notes that he is too sleep in the mornings.  Will cut the AM dose of Risperdal to .0.5 mg.  She notes that she was all the time getting calls from his teachers at school about his inability to sit still.  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  That is what we will try Kapvay for.  Reassured mother that she has done everything that she knew to do.  That he is rather hard headed and has to learn things on his own and that being there for his was her most significant role.  Assured her that she is okay and suggested that she put stickies up all around her world with the words "I am okay" on it.  She asked about follow up for him.  She felt that Dr Toni Arthurs that he saw on the outpatient basis is too quiet and not interactive enough to relate well w [place].  He will need to follow up with Dr Toni Arthurs for now.   ROS: Neuro: no headaches, ataxia, weakness  GI: no N/V/D/cramps/constipation MS: no weakness, aches, but notes tongue discomfort thought related to the Risperdal and Cogentin has been helpful for this.  Will start routine Cogentin for him.  Plan: Adjust Risperdal and Cogentin.  Have nurse practitioner look at his arm for possible phlebitis.  Diagnosis:   Axis I: Alcohol Abuse, Bipolar, Manic, Substance Induced Mood Disorder and R/O ADHD hyperactive type, R/O anxiety Axis II: Deferred Axis III:  Past Medical History  Diagnosis Date  . Asthma    Axis IV: other psychosocial or environmental problems Axis V: 41-50 serious symptoms  ADL's:  Intact  Sleep: Good  Appetite:  Good  Suicidal Ideation:  Pt denies any thoughts, plans, intent of suicide Homicidal Ideation:  Pt denies any  thoughts, plans, intent of homicide  AEB (as evidenced by):per pt report  Mental Status Examination/Evaluation: Objective:  Appearance: Casual  Eye Contact::  Good  Speech:  Clear and Coherent and Normal Rate  Volume:  Normal  Mood:  Anxious and Euthymic  Affect:  Congruent  Thought Process:  Coherent, Linear and Logical  Orientation:  Full  Thought Content:  Hallucinations: Auditory  Suicidal Thoughts:  No  Homicidal Thoughts:  No  Memory:  Immediate;   Fair Recent;   Fair Remote;   Fair  Judgement:  Fair  Insight:  Fair  Psychomotor Activity:  Normal  Concentration:  Fair  Recall:  Fair  Akathisia:  No  Handed:  Right  AIMS (if indicated):     Assets:  Communication Skills Desire for Improvement  Sleep:  Number of Hours: 6.75      Vital Signs:Blood pressure 124/85, pulse 99, temperature 97.7 F (36.5 C), temperature source Oral, resp. rate 16, height 5\' 10"  (1.778 m), weight 81.647 kg (180 lb), SpO2 98.00%.   Current Medications: Current Facility-Administered Medications  Medication Dose Route Frequency Provider Last Rate Last Dose  . acetaminophen (TYLENOL) tablet 650 mg  650 mg Oral Q6H PRN Mike Craze, MD      . alum & mag hydroxide-simeth (MAALOX/MYLANTA) 200-200-20 MG/5ML suspension 30 mL  30 mL Oral Q4H PRN Mike Craze, MD      . benztropine (COGENTIN) tablet 0.5 mg  0.5 mg Oral BID Mike Craze, MD   0.5 mg at 03/15/12 1212  . benztropine (COGENTIN) tablet 1 mg  1 mg Oral Once Mike Craze, MD   1 mg at 03/15/12 1419  . cloNIDine HCl (KAPVAY) ER tablet 0.1 mg  0.1 mg Oral BH-qamhs Mike Craze, MD      . doxycycline (VIBRA-TABS) tablet 100 mg  100 mg Oral Daily Sanjuana Kava, NP   100 mg at 03/15/12 0844  . magnesium hydroxide (MILK OF MAGNESIA) suspension 30 mL  30 mL Oral Daily PRN Mike Craze, MD      . risperiDONE (RISPERDAL) tablet 1.5 mg  1.5 mg Oral BID Mike Craze, MD      . DISCONTD: enoxaparin (LOVENOX) injection 40 mg  40 mg  Subcutaneous Q24H Mike Craze, MD      . DISCONTD: risperiDONE (RISPERDAL) tablet 2 mg  2 mg Oral BID Mike Craze, MD   2 mg at 03/15/12 4098    Lab Results:  No results found for this or any previous visit (from the past 48 hour(s)).  Physical Findings:  Physical Exam: General: Pt well nourished, adult male in no acute distress. Afebrile.  Skin: rt antecubital without redness, swelling, irritation, warmth, drainage. Small ecchymosis noted under  skin at right antecubital. Bilateral antecubitals without any notable temperature differences.  AIMS: Facial and Oral Movements Muscles of Facial Expression: None, normal Lips and Perioral Area: None, normal Jaw: None, normal Tongue: None, normal,Extremity Movements Upper (arms, wrists, hands, fingers): None, normal Lower (legs, knees, ankles, toes): None, normal, Trunk Movements Neck, shoulders, hips: None, normal, Overall Severity Severity of abnormal movements (highest score from questions above): None, normal Incapacitation due to abnormal movements: None, normal Patient's awareness of abnormal movements (rate only patient's report): No Awareness, Dental Status Current problems with teeth and/or dentures?: No Does patient usually wear dentures?: No  CIWA:    COWS:     Treatment Plan Summary: Daily contact with patient to assess and evaluate symptoms and progress in treatment  Medication management  Mood/anxiety less than 3/10 where the scale is 1 is the best and 10 is the worst  Will continue to monitor right antecubital for s/s of infection.  WALKER, EDWIN 03/15/2012, 3:09 PM  Addendum: Norval Gable FNP-BC 03/15/12 1556 pm.

## 2012-03-15 NOTE — Progress Notes (Signed)
D-Per pt self inventory, pt slept well, appetite is good, energy level is normal, ability to pay attention is good, denies depression and hopelessness, denies SI/HI/AVH and contracts for safety, denies pain at this time, Pt reports swelling/tingling of tongue  A-MD notified of pt tongue swelling, Risperdal discontinued per MD, pt concerned about d/c tomorrow, wondering if MD takes him off Risperdal will pt still be able to be discharged, Encouraged pt to talk with MD tomorrow about d/c plans and medication plans for d/c  R-Pt pleasant and cooperative, mother visited with pt, pt informed mother about medication changes, pt in wrap up group

## 2012-03-15 NOTE — Progress Notes (Signed)
Patient ID: Louis Woodward, male   DOB: 06/07/1991, 21 y.o.   MRN: [id number] D-Patient again noticing swelling of his tongue. A- MD informed Patient given stat dose of cogentin 1mg .  R- Patient relieved.

## 2012-03-15 NOTE — Progress Notes (Signed)
Patient ID: Louis Woodward, male   DOB: 08-22-1990, 21 y.o.   MRN: [id number] D-Patient reports he slept well and his appetite is good.  He reports feeling a normal energy level and his ability to pay attention is good.  He reported feeling restless this am and after group stated his mouth was feeling swollen.A- Reported symptoms to MD and R- Patient given cogentin with relief and less restlessness.

## 2012-03-15 NOTE — Progress Notes (Signed)
Patient ID: Louis Woodward, male   DOB: 1991-04-22, 21 y.o.   MRN: [id number]  Pt. attended and participated in aftercare planning group. Pt. listed their current anxiety level as 0 and current depression level as 0. No S/I or H/I. Pt shared that he was feeling great. Pt shared that he plans to return home and follow-up with Dr. Toni Arthurs as well as grief counseling at [facility]. Pt is unsure about his plans for returning to school.

## 2012-03-15 NOTE — Progress Notes (Signed)
I authored most of this note and agree with the additions made by Shireen Quan.

## 2012-03-15 NOTE — Progress Notes (Signed)
Psychoeducational Group Note  Date:  03/15/2012 Time:  2000  Group Topic/Focus:  Wrap-Up Group:   The focus of this group is to help patients review their daily goal of treatment and discuss progress on daily workbooks.  Participation Level:  Minimal  Participation Quality:  Sharing  Affect:  Blunted  Cognitive:  Appropriate  Insight:  Good  Engagement in Group:  Good  Additional Comments:  Patient shared that he looked forward to all that he could get out of this program today and tomorrow.  Itxel Wickard, Newton Pigg 03/15/2012, 9:10 PM

## 2012-03-16 DIAGNOSIS — F909 Attention-deficit hyperactivity disorder, unspecified type: Secondary | ICD-10-CM

## 2012-03-16 MED ORDER — BENZTROPINE MESYLATE 0.5 MG PO TABS: 0.5000 mg | ORAL_TABLET | Freq: Two times a day (BID) | ORAL | Status: DC

## 2012-03-16 MED ORDER — CLONIDINE HCL ER 0.1 MG PO TB12: 0.1000 mg | ORAL_TABLET | ORAL | Status: DC

## 2012-03-16 MED ORDER — DIVALPROEX SODIUM ER 500 MG PO TB24: 500.0000 mg | ORAL_TABLET | Freq: Every day | ORAL | Status: DC

## 2012-03-16 MED ORDER — DOXYCYCLINE HYCLATE 100 MG PO TABS: 100.0000 mg | ORAL_TABLET | Freq: Every day | ORAL | Status: DC

## 2012-03-16 MED ORDER — DIVALPROEX SODIUM ER 500 MG PO TB24
500.0000 mg | ORAL_TABLET | Freq: Every day | ORAL | Status: DC
  Administered 2012-03-16: 500 mg via ORAL
  Filled 2012-03-16 (×4): qty 1

## 2012-03-16 NOTE — Progress Notes (Signed)
[facility] Group Notes:  (Counselor/Nursing/MHT/Case Management/Adjunct)  03/16/2012 3:00 PM  Type of Therapy: Group Therapy   Participation Level:  Pt did not attend.  He was in the DC process.    Marni Griffon C 10/8//2013  3:00 PM

## 2012-03-16 NOTE — [facility] Suicide Risk Assessment (Addendum)
Suicide Risk Assessment  Discharge Assessment     Current Mental Status by Physician: Patient denies suicidal or homicidal ideation, hallucinations, illusions, or delusions. Patient engages with good eye contact, is able to focus adequately in a one to one setting, and has clear goal directed thoughts. Patient speaks with a natural conversational volume, rate, and tone. Anxiety was reported at 1 on a scale of 1 the least and 10 the most. Depression was reported at 1 on the same scale. Patient is oriented times 4, recent and remote memory intact. Judgement: improved from admission Insight: improved from admission  Demographic factors:  Male;Caucasian;Unemployed Loss Factors:    Historical Factors:    Risk Reduction Factors:  Sense of responsibility to family;Living with another person, especially a relative;Positive social support  Continued Clinical Symptoms:  Severe Anxiety and/or Agitation Bipolar Disorder:   Bipolar II Alcohol/Substance Abuse/Dependencies Previous Psychiatric Diagnoses and Treatments  Discharge Diagnoses: Axis I: Alcohol Abuse, Bipolar, Manic, Substance Induced Mood Disorder and ADHD hyperactive type, R/O anxiety  Axis II: Deferred  Axis III:  Past Medical History   Diagnosis  Date   .  Asthma     Axis IV: other psychosocial or environmental problems  Axis V: 61-70 mild symptoms   Cognitive Features That Contribute To Risk:  Thought constriction (tunnel vision)    Suicide Risk:  Minimal: No identifiable suicidal ideation.  Patients presenting with no risk factors but with morbid ruminations; may be classified as minimal risk based on the severity of the depressive symptoms  Labs:  No results found for this or any previous visit (from the past 72 hour(s)). RISK REDUCTION FACTORS: What pt has learned from hospital stay is about coping skills, need to be on medications, and that they need to live for themselves.  Risk of self harm is elevated by their  past history of risk taking behavior and pleasing others as well as bipolar disorder and addiction to both substances and to fixing other people.  Risk of harm to others is minimal in that he has not been involved in fights or had any legal charges filed on him.  Pt seen in treatment team where he divulged the above information. The treatment team concluded that he was ready for discharge and had met his goals for an inpatient setting.  PLAN: Discharge home Continue   Medication List     As of 03/16/2012  1:38 PM    TAKE these medications      Indication    benztropine 0.5 MG tablet   Commonly known as: COGENTIN   Take 1 tablet (0.5 mg total) by mouth 2 (two) times daily. For side effect from Risperdal take until gone, may taper to only one a day.    Indication: Stiffness and tongue symptoms from Risperdal      cloNIDine HCl 0.1 MG Tb12 ER tablet   Commonly known as: KAPVAY   Take 1 tablet (0.1 mg total) by mouth 2 (two) times daily in the am and at bedtime.. For hyperactivity, doesn't help focus that much.    Indication: Attention Deficit Hyperactivity Disorder      divalproex 500 MG 24 hr tablet   Commonly known as: DEPAKOTE ER   Take 1 tablet (500 mg total) by mouth daily. For mood control. Probably need to increase to two at night after about 03/19/2012    Indication: Manic Phase of Manic-Depression      doxycycline 100 MG tablet   Commonly known as: VIBRA-TABS   Take  1 tablet (100 mg total) by mouth daily. For bumps on head        Follow-up recommendations:  Activities: Resume typical activities Diet: Resume typical diet Tests: Follow up on the Depakote level. Other: Follow up with outpatient provider and report any side effects to out patient prescriber.  Is patient on multiple antipsychotic therapies at discharge: No  Has Patient had three or more failed trials of antipsychotic monotherapy by history: N/A  Recommended Plan for Multiple Antipsychotic Therapies:  N/A  Dan Humphreys, Wade Sigala 03/16/2012 1:38 PM

## 2012-03-16 NOTE — Progress Notes (Signed)
Patient asleep since the beginning of the shift. Respirations even and unlabored. No distress noted. Patient 's mother called to inquire on patient status. She asked about the reaction he had yesterday and if he's doing all right. Writer told patient mother that patient received benadryl around 2030 pm to help with the reaction and was already asleep before the beginning of this shift. She also stated that she would be here to eat breakfast with patient in the morning. Writer encouraged and provided support to mother.

## 2012-03-16 NOTE — Progress Notes (Signed)
[facility] Case Management Discharge Plan:  Will you be returning to the same living situation after discharge: Yes,  Patient will return to his parent's home At discharge, do you have transportation home?:Yes,  Mother to transport patient home Do you have the ability to pay for your medications:Yes,  Patient has private insurance and can purchase meds.  Interagency Information:     Release of information consent forms completed and in the chart;  Patient's signature needed at discharge.  Patient to Follow up at:  Follow-up Information    On 03/18/2012 to follow up. (Patient has an appointment with Dr. Toni Arthurs on Thursday, March 18, 2012 at 4:45pm. Please call to confirm this appointment or it will be cancelled )    Contact information:   [address]      Follow up with Daine Gip. On 03/22/2012. (You are scheduled with Glendell Docker on Monday, March 22, 2012 at 1:00 PM)    Contact information:   [address] [place], [place]  [address]  [phone]       Follow up with Hospice. On 03/17/2012. (Someone from Hospice will call you at home on Wednesday, March 17, 2012 )    Contact information:   [address] [place], [place]  [address]  [phone]         Patient denies SI/HI:   Yes.  Patient is not endorsing SI/HI or other thoughts of sefl harm  Safety Planning and Suicide Prevention discussed:  Yes,  Reviewed with all patient during after care group  Barrier to discharge identified:Yes,  Patient will be withdrawing from school and unemployment  Summary and Recommendations: Patient encouraged to be compliant with medications and to follow up with all outpatient recommendations.    Wynn Banker 03/16/2012, 1:03 PM

## 2012-03-16 NOTE — Progress Notes (Signed)
D:  Patient's self inventory sheet, patient sleeps well, has good appetite, normal energy level, good attention span.  Rated depression and hopelessness #1.  Denied withdrawals.   Denied SI.  No physical problems in past 24 hours.   "I plan on managing my meds, make better choices, and use coping skill to manage by bipolar mania."  Does have discharge plans.  No problems taking meds after discharge. A:  Medications given per MD order.  Support and encouragement given throughout day.   Support and safety checks completed as ordered. R:  Following treatment plan.   Denied SI and HI.  Denied A/V hallucinations.  Patient remains safe and receptive on unit.  Patient has been cooperative and pleasant today.  Attended groups and participated in groups.

## 2012-03-16 NOTE — Progress Notes (Signed)
Discharge Note:  Patient discharged with parents to his home.   Denied SI and HI.   Denied A/V hallucinations.   Denied pain.  Stated he appreciates all the staff has done to assist him while at [facility].  Patient received all his discharge instructions which he stated he understood and had no questions.   Patient received all his belongings, etc.  Patient has been cooperative and pleasant.

## 2012-03-17 NOTE — Progress Notes (Signed)
Patient Discharge Instructions:  After Visit Summary (AVS):   Faxed to:  03/17/2012 Psychiatric Admission Assessment Note:   Faxed to:  03/17/2012 Suicide Risk Assessment - Discharge Assessment:   Faxed to:  03/17/2012 Faxed/Sent to the Next Level Care provider:  03/17/2012  Faxed to Hospice @ [phone] and Glendell Docker @ [phone]  Karleen Hampshire Brittini, 03/17/2012, 3:55 PM

## 2012-03-22 NOTE — Progress Notes (Signed)
Patient Discharge Instructions:  After Visit Summary (AVS):   Faxed to:  03/22/13 Discharge Summary Note:   Faxed to:  03/22/13 Psychiatric Admission Assessment Note:   Faxed to:  03/22/13 Suicide Risk Assessment - Discharge Assessment:   Faxed to:  03/22/12 Faxed/Sent to the Next Level Care provider:  03/22/12  Faxed to Dr. Len Blalock @ [phone]  Karleen Hampshire Brittini, 03/22/2012, 3:39 PM

## 2012-04-20 NOTE — Discharge Summary (Signed)
Physician Discharge Summary Note  Patient:  Louis Woodward is an 21 y.o., male MRN:  [id number] DOB:  03/04/1991 Patient phone:  [phone] (home)  Patient address:   [address] [place] [address]   Date of Admission:  03/11/2012 Date of Discharge: 03/16/2012  Discharge Diagnoses: Principal Problem:  *Bipolar disorder, manic phase Active Problems:  Alcohol dependence  ADHD, predominantly hyperactive type  Axis Diagnosis:  Axis I: Alcohol Abuse, Bipolar, Manic, Substance Induced Mood Disorder and ADHD hyperactive type, R/O anxiety  Axis II: Deferred  Axis III:  Past Medical History   Diagnosis  Date   .  Asthma    Axis IV: other psychosocial or environmental problems  Axis V: 61-70 mild symptoms   Level of Care:  OP  Hospital Course:   This is an 21 year old Caucasian male, admitted to [facility] from the [facility] ED with reports of hyper-insomnia, Binge drinking and manic episodes. Patient reports, "My mother took me to the hospital because I asked her to do just that. I have been having high energy levels, shakiness, anxiousness and panic mode whereby I feel like I can't breath. At other times, I may feel irritable. This has been going on for a while. 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 While I was here in [place] living with my parents and attending school, I was always wide open. I am constantly doing stuff, I mean good stuff. I am never tired. But in college, things are different. I found out that I cannot be wide open any more. I needed to slow down, stay focus and study. That was not happening for me. I also has not bee sleeping for a while. Even though I was not sleeping, I never felt tired and or sluggish. Last weekend, my school was playing [employer] All my friends were going to be coming home for this game, so I decided to come home too. On Friday night, I hung out with my friends. They were  drinking, I drank with them. I ended up overdosing on alcohol. I was completed out and wasted. I was taken to the hospital were I was in a life support x 6 hours. I spooked my mother, and she was not happy with me. Later, I saw my psychiatrist Len Blalock. I told him what has been going on with me. He suggested that I should come to the hospital and I agreed. I need to be sleeping at night. But, I don't want to depend on medication for that. I am not depressed. I am a pretty happy person by nature. For the past few days, things has been a little not so together with me. It got o a point where I thought I was seeing some weird looking dog".  While a patient in this hospital, Louis Woodward was enrolled in group counseling and activities as well as received the following medication No current facility-administered medications for this encounter. Current outpatient prescriptions:benztropine (COGENTIN) 0.5 MG tablet, Take 1 tablet (0.5 mg total) by mouth 2 (two) times daily. For side effect from Risperdal take until gone, may taper to only one a day., Disp: 8 tablet, Rfl: 0;  cloNIDine HCl (KAPVAY) 0.1 MG TB12 ER tablet, Take 1 tablet (0.1 mg total) by mouth 2 (two) times daily in the am and at bedtime.. For hyperactivity, doesn't help focus that much., Disp: 60 tablet, Rfl: 0 divalproex (DEPAKOTE ER) 500  MG 24 hr tablet, Take 1 tablet (500 mg total) by mouth daily. For mood control. Probably need to increase to two at night after about 03/19/2012, Disp: 60 tablet, Rfl: 0;  doxycycline (VIBRA-TABS) 100 MG tablet, Take 1 tablet (100 mg total) by mouth daily. For bumps on head, Disp: 30 tablet, Rfl: 0  Patient attended treatment team meeting this am and met with treatment team members. Pt symptoms, treatment plan and response to treatment discussed. Louis Woodward endorsed that their symptoms have improved. Pt also stated that they are stable for discharge.  In other to control Principal Problem:  *Bipolar disorder,  manic phase Active Problems:  Alcohol dependence  ADHD, predominantly hyperactive type , they will continue psychiatric care on outpatient basis. They will follow-up at  Follow-up Information    Follow up with Dr. Toni Arthurs. On 03/18/2012. (Patient has an appointment with Dr. Toni Arthurs on Thursday, March 18, 2012 at 4:45pm. Please call to confirm this appointment or it will be cancelled )       Follow up with Daine Gip. On 03/22/2012. (You are scheduled with Glendell Docker on Monday, March 22, 2012 at 1:00 PM)    Contact information:   [address] [place], [place]  [address]  [phone]       Follow up with Hospice. On 03/17/2012. (Someone from Hospice will call you at home on Wednesday, March 17, 2012 regarding grief counseling.)    Contact information:   [address] [place], [place]  [address]  [phone]       .  In addition they were instructed to take all your medications as prescribed by your mental healthcare provider, to report any adverse effects and or reactions from your medicines to your outpatient provider promptly, patient is instructed and cautioned to not engage in alcohol and or illegal drug use while on prescription medicines, in the event of worsening symptoms, patient is instructed to call the crisis hotline, 911 and or go to the nearest ED for appropriate evaluation and treatment of symptoms.   Upon discharge, patient adamantly denies suicidal, homicidal ideations, auditory, visual hallucinations and or delusional thinking. They left [facility] with all personal belongings in no apparent distress.  Consults:  See electronic record for details  Significant Diagnostic Studies:  See electronic record for details  Discharge Vitals:   Blood pressure 114/79, pulse 103, temperature 97.7 F (36.5 C), temperature source Oral, resp. rate 18, height 5\' 10"  (1.778 m), weight 180 lb (81.647 kg), SpO2 98.00%..  Mental Status Exam: See Mental Status Examination and  Suicide Risk Assessment completed by Attending Physician prior to discharge.  Discharge destination:  Home  Is patient on multiple antipsychotic therapies at discharge:  No  Has Patient had three or more failed trials of antipsychotic monotherapy by history: N/A Recommended Plan for Multiple Antipsychotic Therapies: N/A    Medication List     As of 04/20/2012  3:02 PM    TAKE these medications      Indication    benztropine 0.5 MG tablet   Commonly known as: COGENTIN   Take 1 tablet (0.5 mg total) by mouth 2 (two) times daily. For side effect from Risperdal take until gone, may taper to only one a day.    Indication: Stiffness and tongue symptoms from Risperdal      cloNIDine HCl 0.1 MG Tb12 ER tablet   Commonly known as: KAPVAY   Take 1 tablet (0.1 mg total) by mouth 2 (two) times daily in the am  and at bedtime.. For hyperactivity, doesn't help focus that much.    Indication: Attention Deficit Hyperactivity Disorder      divalproex 500 MG 24 hr tablet   Commonly known as: DEPAKOTE ER   Take 1 tablet (500 mg total) by mouth daily. For mood control. Probably need to increase to two at night after about 03/19/2012    Indication: Manic Phase of Manic-Depression      doxycycline 100 MG tablet   Commonly known as: VIBRA-TABS   Take 1 tablet (100 mg total) by mouth daily. For bumps on head            Follow-up Information    Follow up with Dr. Toni Arthurs. On 03/18/2012. (Patient has an appointment with Dr. Toni Arthurs on Thursday, March 18, 2012 at 4:45pm. Please call to confirm this appointment or it will be cancelled )       Follow up with Daine Gip. On 03/22/2012. (You are scheduled with Glendell Docker on Monday, March 22, 2012 at 1:00 PM)    Contact information:   [address] [place], [place]  [address]  [phone]       Follow up with Hospice. On 03/17/2012. (Someone from Hospice will call you at home on Wednesday, March 17, 2012 regarding grief counseling.)      Contact information:   [address] [place], [place]  [address]  [phone]        Follow-up recommendations:   Activities: Resume typical activities Diet: Resume typical diet Tests: none Other: Follow up with outpatient provider and report any side effects to out patient prescriber.  Comments:  Take all your medications as prescribed by your mental healthcare provider. Report any adverse effects and or reactions from your medicines to your outpatient provider promptly. Patient is instructed and cautioned to not engage in alcohol and or illegal drug use while on prescription medicines. In the event of worsening symptoms, patient is instructed to call the crisis hotline, 911 and or go to the nearest ED for appropriate evaluation and treatment of symptoms. Follow-up with your primary care provider for your other medical issues, concerns and or health care needs.  SignedDan Humphreys, Maelynn Moroney 04/20/2012 3:02 PM

## 2012-09-09 ENCOUNTER — Ambulatory Visit (INDEPENDENT_AMBULATORY_CARE_PROVIDER_SITE_OTHER): Payer: BC Managed Care – PPO | Admitting: Family Medicine

## 2012-09-09 ENCOUNTER — Encounter: Payer: Self-pay | Admitting: Family Medicine

## 2012-09-09 VITALS — BP 118/70 | HR 64 | Ht 72.0 in | Wt 207.0 lb

## 2012-09-09 DIAGNOSIS — I861 Scrotal varices: Secondary | ICD-10-CM

## 2012-09-09 DIAGNOSIS — Z0000 Encounter for general adult medical examination without abnormal findings: Secondary | ICD-10-CM

## 2012-09-09 DIAGNOSIS — L259 Unspecified contact dermatitis, unspecified cause: Secondary | ICD-10-CM

## 2012-09-09 DIAGNOSIS — Z8709 Personal history of other diseases of the respiratory system: Secondary | ICD-10-CM

## 2012-09-09 DIAGNOSIS — L739 Follicular disorder, unspecified: Secondary | ICD-10-CM

## 2012-09-09 DIAGNOSIS — L738 Other specified follicular disorders: Secondary | ICD-10-CM

## 2012-09-09 DIAGNOSIS — L309 Dermatitis, unspecified: Secondary | ICD-10-CM

## 2012-09-09 DIAGNOSIS — J309 Allergic rhinitis, unspecified: Secondary | ICD-10-CM

## 2012-09-09 LAB — LIPID PANEL
Cholesterol: 145 mg/dL (ref 0–200)
Total CHOL/HDL Ratio: 3.4 Ratio

## 2012-09-09 NOTE — Patient Instructions (Signed)
Contact Dermatitis Contact dermatitis is a reaction to certain substances that touch the skin. Contact dermatitis can be either irritant contact dermatitis or allergic contact dermatitis. Irritant contact dermatitis does not require previous exposure to the substance for a reaction to occur.Allergic contact dermatitis only occurs if you have been exposed to the substance before. Upon a repeat exposure, your body reacts to the substance.  CAUSES  Many substances can cause contact dermatitis. Irritant dermatitis is most commonly caused by repeated exposure to mildly irritating substances, such as:  Makeup.  Soaps.  Detergents.  Bleaches.  Acids.  Metal salts, such as nickel. Allergic contact dermatitis is most commonly caused by exposure to:  Poisonous plants.  Chemicals (deodorants, shampoos).  Jewelry.  Latex.  Neomycin in triple antibiotic cream.  Preservatives in products, including clothing. SYMPTOMS  The area of skin that is exposed may develop:  Dryness or flaking.  Redness.  Cracks.  Itching.  Pain or a burning sensation.  Blisters. With allergic contact dermatitis, there may also be swelling in areas such as the eyelids, mouth, or genitals.  DIAGNOSIS  Your caregiver can usually tell what the problem is by doing a physical exam. In cases where the cause is uncertain and an allergic contact dermatitis is suspected, a patch skin test may be performed to help determine the cause of your dermatitis. TREATMENT Treatment includes protecting the skin from further contact with the irritating substance by avoiding that substance if possible. Barrier creams, powders, and gloves may be helpful. Your caregiver may also recommend:  Steroid creams or ointments applied 2 times daily. For best results, soak the rash area in cool water for 20 minutes. Then apply the medicine. Cover the area with a plastic wrap. You can store the steroid cream in the refrigerator for a "chilly"  effect on your rash. That may decrease itching. Oral steroid medicines may be needed in more severe cases.  Antibiotics or antibacterial ointments if a skin infection is present.  Antihistamine lotion or an antihistamine taken by mouth to ease itching.  Lubricants to keep moisture in your skin.  Burow's solution to reduce redness and soreness or to dry a weeping rash. Mix one packet or tablet of solution in 2 cups cool water. Dip a clean washcloth in the mixture, wring it out a bit, and put it on the affected area. Leave the cloth in place for 30 minutes. Do this as often as possible throughout the day.  Taking several cornstarch or baking soda baths daily if the area is too large to cover with a washcloth. Harsh chemicals, such as alkalis or acids, can cause skin damage that is like a burn. You should flush your skin for 15 to 20 minutes with cold water after such an exposure. You should also seek immediate medical care after exposure. Bandages (dressings), antibiotics, and pain medicine may be needed for severely irritated skin.  HOME CARE INSTRUCTIONS  Avoid the substance that caused your reaction.  Keep the area of skin that is affected away from hot water, soap, sunlight, chemicals, acidic substances, or anything else that would irritate your skin.  Do not scratch the rash. Scratching may cause the rash to become infected.  You may take cool baths to help stop the itching.  Only take over-the-counter or prescription medicines as directed by your caregiver.  See your caregiver for follow-up care as directed to make sure your skin is healing properly. SEEK MEDICAL CARE IF:   Your condition is not better after 3   days of treatment.  You seem to be getting worse.  You see signs of infection such as swelling, tenderness, redness, soreness, or warmth in the affected area.  You have any problems related to your medicines. Document Released: 05/23/2000 Document Revised: 08/18/2011  Document Reviewed: 10/29/2010 [facility] Patient Information 2013 [place], [place].   Use cool compresses on the affected areas as well as topical steroids and potentially Benadryl at night. If it gets worse, give me a call

## 2012-09-09 NOTE — Progress Notes (Signed)
Subjective:    Patient ID: Louis Woodward, male    DOB: June 05, 1991, 22 y.o.   MRN: [id number]  HPI He is here for complete examination. He does have an underlying history of allergies which seem to be with well-controlled. He has a previous history of asthma but has not had difficulty with that. He also has a history of eczema. Recently he has been exposed to poison ivy and does have some lesions present on his right wrist area . He is being followed by Dr. Danella Deis for folliculitis of the scalp and presently is on chronic doxycycline which seems to keep it under control. He is also being followed by a psychiatrist. Apparently he had difficulty dealing with life stresses including school stress and grandfather dying. Presently he is being seen every other month by a psychiatrist and a stable on his medications. He also has a previous history of alcohol intoxication with a three-day stay in the hospital. At this time he is no longer drinking. He works and is going to school. He is not presently dating anyone. Social and family history were reviewed.   Review of Systems Negative except as above    Objective:   Physical Exam BP 118/70  Pulse 64  Ht 6' (1.829 m)  Wt 207 lb (93.895 kg)  BMI 28.07 kg/m2  SpO2 98%  General Appearance:    Alert, cooperative, no distress, appears stated age  Head:    Normocephalic, without obvious abnormality, atraumatic  Eyes:    PERRL, conjunctiva/corneas clear, EOM's intact, fundi    benign  Ears:    Normal TM's and external ear canals  Nose:   Nares normal, mucosa normal, no drainage or sinus   tenderness  Throat:   Lips, mucosa, and tongue normal; teeth and gums normal  Neck:   Supple, no lymphadenopathy;  thyroid:  no   enlargement/tenderness/nodules; no carotid   bruit or JVD  Back:    Spine nontender, no curvature, ROM normal, no CVA     tenderness  Lungs:     Clear to auscultation bilaterally without wheezes, rales or     ronchi; respirations unlabored   Chest Wall:    No tenderness or deformity   Heart:    Regular rate and rhythm, S1 and S2 normal, no murmur, rub   or gallop  Breast Exam:    No chest wall tenderness, masses or gynecomastia  Abdomen:     Soft, non-tender, nondistended, normoactive bowel sounds,    no masses, no hepatosplenomegaly  Genitalia:    Normal male external genitalia without lesions.  Testicles without masses.  No inguinal hernias. Left-sided varicoceles noted  Rectal:    Normal sphincter tone, no masses or tenderness; guaiac negative stool.  Prostate smooth, no nodules, not enlarged.  Extremities:   No clubbing, cyanosis or edema  Pulses:   2+ and symmetric all extremities  Skin:   Skin color, texture, turgor normal, erythematous slightly raised lesions noted on the ventral surface of his right proximal wrist. Exam of his scalp does show scattered follicular lesions.  Lymph nodes:   Cervical, supraclavicular, and axillary nodes normal  Neurologic:   CNII-XII intact, normal strength, sensation and gait; reflexes 2+ and symmetric throughout          Psych:   Normal mood, affect, hygiene and grooming.           Assessment & Plan:  Routine general medical examination at a health care facility - Plan: Lipid panel  Allergic rhinitis,  mild  History of asthma  Eczema  Chronic folliculitis  Left varicocele  Contact dermatitis recommend cortisone cream for the rash. Continue on the doxycycline and continue to be followed by psychiatry. Discussed the varicocele and no therapy is needed at this time. Lipid panel is ordered.

## 2012-09-10 ENCOUNTER — Encounter: Payer: Self-pay | Admitting: Internal Medicine

## 2012-09-10 NOTE — Progress Notes (Signed)
Quick Note:  PT INFORMED AND VERBALIZED UNDERSTANDING  ______ 

## 2013-12-15 IMAGING — CR DG CHEST 1V PORT
1 series · 1 of 1 positions shown · non-contrast
Comparison: 03/29/2008

CLINICAL DATA: Alcohol intoxication and evaluate endotracheal tube.

PORTABLE CHEST - 1 VIEW

[AP]
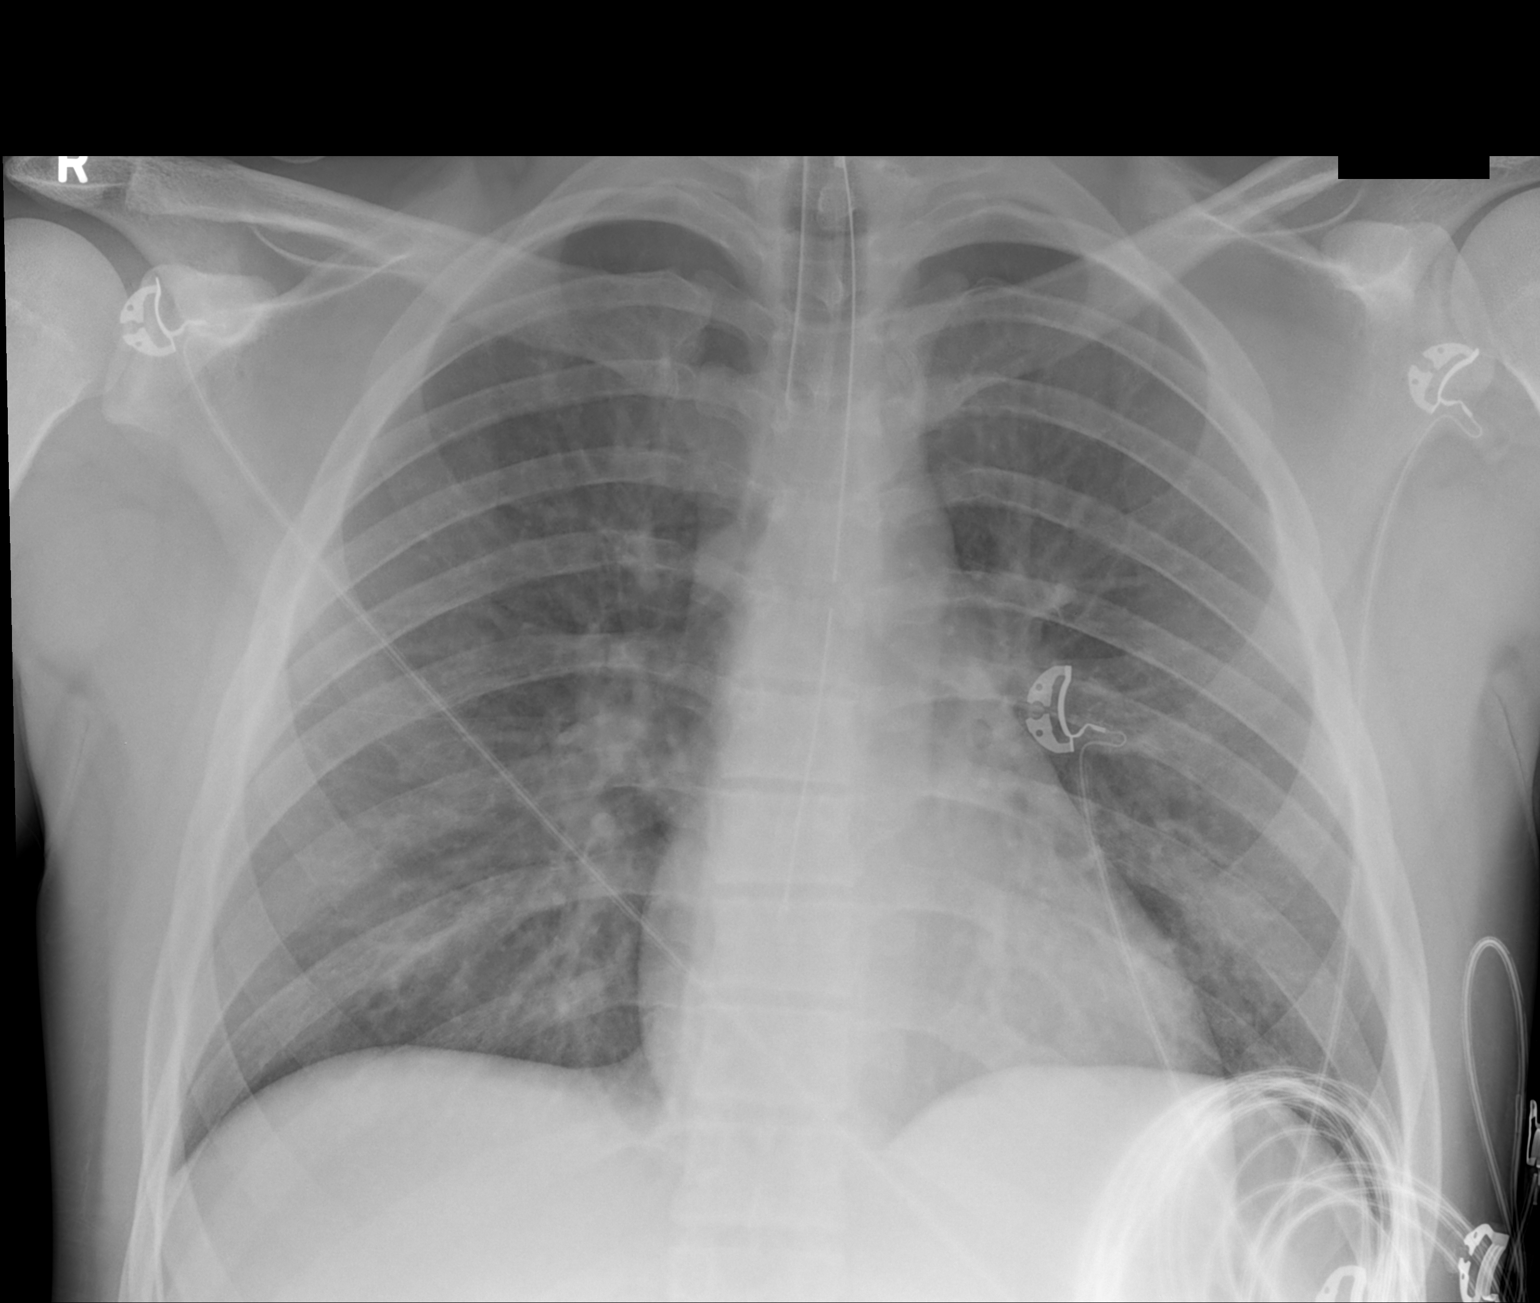

[1 of 1 positions shown; findings below may reference images not displayed]

FINDINGS: Endotracheal tube is 4.9 cm above the carina.
Nasogastric tube is in the distal esophagus region.  The lungs are
clear without airspace disease.  Normal appearance of the heart and
mediastinum.
IMPRESSION: Endotracheal tube is appropriately positioned above the carina.

Nasogastric tube appears to be in the distal esophagus and should
be advanced for optimal positioning.

No focal chest disease.

## 2014-11-13 ENCOUNTER — Encounter: Payer: Self-pay | Admitting: Family Medicine

## 2014-11-13 ENCOUNTER — Ambulatory Visit (INDEPENDENT_AMBULATORY_CARE_PROVIDER_SITE_OTHER): Payer: BLUE CROSS/BLUE SHIELD | Admitting: Family Medicine

## 2014-11-13 VITALS — BP 120/80 | HR 82 | Temp 99.9°F | Wt 220.0 lb

## 2014-11-13 DIAGNOSIS — F3110 Bipolar disorder, current episode manic without psychotic features, unspecified: Secondary | ICD-10-CM | POA: Diagnosis not present

## 2014-11-13 DIAGNOSIS — J208 Acute bronchitis due to other specified organisms: Secondary | ICD-10-CM | POA: Diagnosis not present

## 2014-11-13 DIAGNOSIS — J309 Allergic rhinitis, unspecified: Secondary | ICD-10-CM

## 2014-11-13 DIAGNOSIS — Z8709 Personal history of other diseases of the respiratory system: Secondary | ICD-10-CM | POA: Diagnosis not present

## 2014-11-13 MED ORDER — CLARITHROMYCIN 500 MG PO TABS: 500.0000 mg | ORAL_TABLET | Freq: Two times a day (BID) | ORAL | Status: DC

## 2014-11-13 NOTE — Progress Notes (Signed)
   Subjective:    Patient ID: Louis Woodward, male    DOB: 1991/02/14, 24 y.o.   MRN: [id number]  HPI He complains of a 7 day history this started with fever, fatigue, myalgias and cough. The cough quickly became productive of brownish sputum. No sore throat or earache. He does not smoke. He does have underlying allergies and remote history of asthma. He treats his allergies with Claritin. His symptoms are mainly sneezing, itchy watery eyes and rhinorrhea. He also has a previous history of bipolar disorder but apparently not on any medication. He blames the problems on the death of his grandfather that had a profound effect on him. 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  Review of Systems     Objective:   Physical Exam Alert and in no distress. Tympanic membranes and canals are normal. Pharyngeal area is normal. Neck is supple without adenopathy or thyromegaly. Cardiac exam shows a regular sinus rhythm without murmurs or gallops. Lungs are clear to auscultation.        Assessment & Plan:  Acute bronchitis due to other specified organisms - Plan: clarithromycin (BIAXIN) 500 MG tablet  Allergic rhinitis, mild  History of asthma  Bipolar disorder, manic phase His symptoms sounds a thickened been pneumococcal disease and I will therefore place him on Biaxin since he has an allergy to penicillin. Encouraged him to set up an appointment for the CDL exam. He is also to discuss the concealed weapon permit with his psychiatrist.

## 2014-11-13 NOTE — Patient Instructions (Signed)
Take all medicine and if you're not totally back to normal when you finish it give me a call

## 2015-03-11 ENCOUNTER — Ambulatory Visit (INDEPENDENT_AMBULATORY_CARE_PROVIDER_SITE_OTHER): Payer: BLUE CROSS/BLUE SHIELD | Admitting: Family Medicine

## 2015-03-11 VITALS — BP 118/70 | HR 81 | Temp 98.1°F | Resp 18 | Ht 72.0 in | Wt 231.0 lb

## 2015-03-11 DIAGNOSIS — R599 Enlarged lymph nodes, unspecified: Secondary | ICD-10-CM | POA: Diagnosis not present

## 2015-03-11 DIAGNOSIS — J0100 Acute maxillary sinusitis, unspecified: Secondary | ICD-10-CM

## 2015-03-11 DIAGNOSIS — L0100 Impetigo, unspecified: Secondary | ICD-10-CM

## 2015-03-11 DIAGNOSIS — R590 Localized enlarged lymph nodes: Secondary | ICD-10-CM

## 2015-03-11 DIAGNOSIS — K047 Periapical abscess without sinus: Secondary | ICD-10-CM

## 2015-03-11 LAB — POCT CBC
Granulocyte percent: 57.6 %G (ref 37–80)
HEMATOCRIT: 46.2 % (ref 43.5–53.7)
Hemoglobin: 15.1 g/dL (ref 14.1–18.1)
LYMPH, POC: 2 (ref 0.6–3.4)
MCH, POC: 27.3 pg (ref 27–31.2)
MCHC: 32.7 g/dL (ref 31.8–35.4)
MCV: 83.5 fL (ref 80–97)
MID (CBC): 0.7 (ref 0–0.9)
MPV: 6.3 fL (ref 0–99.8)
POC GRANULOCYTE: 3.7 (ref 2–6.9)
POC LYMPH %: 30.7 % (ref 10–50)
POC MID %: 11.7 % (ref 0–12)
Platelet Count, POC: 209 10*3/uL (ref 142–424)
RBC: 5.53 M/uL (ref 4.69–6.13)
RDW, POC: 13.3 %
WBC: 6.4 10*3/uL (ref 4.6–10.2)

## 2015-03-11 MED ORDER — MUPIROCIN 2 % EX OINT: 1.0000 "application " | TOPICAL_OINTMENT | Freq: Four times a day (QID) | CUTANEOUS | Status: DC

## 2015-03-11 MED ORDER — CLINDAMYCIN HCL 300 MG PO CAPS: 300.0000 mg | ORAL_CAPSULE | Freq: Three times a day (TID) | ORAL | Status: DC

## 2015-03-11 NOTE — Patient Instructions (Signed)
Cervical Adenitis °You have a swollen lymph gland in your neck. This commonly happens with Strep and virus infections, dental problems, insect bites, and injuries about the face, scalp, or neck. The lymph glands swell as the body fights the infection or heals the injury. Swelling and firmness typically lasts for several weeks after the infection or injury is healed. Rarely lymph glands can become swollen because of cancer or TB. °Antibiotics are prescribed if there is evidence of an infection. Sometimes an infected lymph gland becomes filled with pus. This condition may require opening up the abscessed gland by draining it surgically. Most of the time infected glands return to normal within two weeks. Do not poke or squeeze the swollen lymph nodes. That may keep them from shrinking back to their normal size. If the lymph gland is still swollen after 2 weeks, further medical evaluation is needed.  °SEEK IMMEDIATE MEDICAL CARE IF:  °You have difficulty swallowing or breathing, increased swelling, severe pain, or a high fever.  °Document Released: 05/26/2005 Document Revised: 08/18/2011 Document Reviewed: 11/15/2006 °[employer]® Patient Information ©2015 [employer]. This information is not intended to replace advice given to you by your health care provider. Make sure you discuss any questions you have with your health care provider. ° °

## 2015-03-11 NOTE — Progress Notes (Signed)
Subjective:  This chart was scribed for Louis Sorenson, MD by [facility], medical scribe at [facility].The patient was seen in exam room 09 and the patient's care was started at 1:07 PM.   Patient ID: Louis Woodward, male    DOB: 1991-03-21, 24 y.o.   MRN: [id number] Chief Complaint  Patient presents with  . Rash  . Facial Swelling    over 2 weeks now   . Dental Problem    gum bleeding   . neck swelling  . Sore Throat  . Mass    back of head, gets them off and on    HPI HPI Comments: Louis Woodward is a 24 y.o. male who presents to [facility] complaining of a rash, dental pain, neck swelling, and a sore throat. Neck swelling noticed two days ago. 101 fever yesterday. Associated cough and nasal congestion.Taking benadryl and ibuprofen. Rash began 3-4 days ago, he has been using a triple abx cream for this. States his wisdom teeth are coming in and his gums are swollen and tender. Last abx in June or May. Hx of bipolar disorder which he has been hospitalized for at behavioral health. PCP is Dr. Susann Givens. He was on clarithromycin in June.   Past Medical History  Diagnosis Date  . Asthma   . Allergy    Current Outpatient Prescriptions on File Prior to Visit  Medication Sig Dispense Refill  . clarithromycin (BIAXIN) 500 MG tablet Take 1 tablet (500 mg total) by mouth 2 (two) times daily. (Patient not taking: Reported on 03/11/2015) 20 tablet 0  . cloNIDine HCl (KAPVAY) 0.1 MG TB12 ER tablet Take 1 tablet (0.1 mg total) by mouth 2 (two) times daily in the am and at bedtime.. For hyperactivity, doesn't help focus that much. (Patient not taking: Reported on 11/13/2014) 60 tablet 0  . divalproex (DEPAKOTE ER) 500 MG 24 hr tablet Take 1 tablet (500 mg total) by mouth daily. For mood control. Probably need to increase to two at night after about 03/19/2012 (Patient not taking: Reported on 11/13/2014) 60 tablet 0  . doxycycline (VIBRA-TABS) 100 MG tablet Take 1 tablet  (100 mg total) by mouth daily. For bumps on head (Patient not taking: Reported on 11/13/2014) 30 tablet 0   No current facility-administered medications on file prior to visit.   Allergies  Allergen Reactions  . Risperdal [Risperidone] Other (See Comments)    Tongue swelling and lip twitching after a couple of days  . Amoxicillin Er Rash  . Cefazolin Rash   Depression screen [facility] 2/9 03/14/2015 03/11/2015  Decreased Interest 0 0  Down, Depressed, Hopeless 0 0  PHQ - 2 Score 0 0     Review of Systems  Constitutional: Positive for fever, chills, diaphoresis, appetite change and fatigue. Negative for activity change.  HENT: Positive for congestion, dental problem, facial swelling, mouth sores, rhinorrhea, sinus pressure, sore throat and trouble swallowing. Negative for drooling, postnasal drip, sneezing and voice change.   Respiratory: Positive for cough.   Musculoskeletal: Negative for myalgias and joint swelling.  Skin: Positive for color change, rash and wound.  Allergic/Immunologic: Negative for immunocompromised state.  Neurological: Negative for speech difficulty and weakness.  Hematological: Positive for adenopathy. Does not bruise/bleed easily.  Psychiatric/Behavioral: Negative for dysphoric mood.       Objective:  BP 118/70 mmHg  Pulse 81  Temp(Src) 98.1 F (36.7 C) (Oral)  Resp 18  Ht 6' (1.829 m)  Wt 231  lb (104.781 kg)  BMI 31.32 kg/m2  SpO2 98% Physical Exam  Constitutional: He is oriented to person, place, and time. He appears well-developed and well-nourished. No distress.  HENT:  Head: Normocephalic and atraumatic.  Right Ear: Tympanic membrane is injected and retracted.  Left Ear: Tympanic membrane normal.  Nose: Mucosal edema present.  Erythematous left tonsillar pillarand left wisdom tooth. Gum is erythematous and edematous with a small amount of petechiae and question of small purulent drainage. Severe right worse than left nasal mucosal edema, and  erythema.  Eyes: Pupils are equal, round, and reactive to light.  Neck: Normal range of motion.  Small amount tonsillar sub mand lymphadenopathy. No significant posterior or anterior on the left. But the left sign sub mand post and ant cervical adenopathy. No post but pos pre auricular.  Cardiovascular: Normal rate, regular rhythm and normal heart sounds.   Pulmonary/Chest: Effort normal and breath sounds normal. No respiratory distress.  Musculoskeletal: Normal range of motion.  Neurological: He is alert and oriented to person, place, and time.  Skin: Skin is warm and dry.  Num nodular erythematous papules with central excoriation and some pustules that have been ulcerative crusted with honey color crust spread around left jaw line and left nares.  Psychiatric: He has a normal mood and affect. His behavior is normal.  Nursing note and vitals reviewed.     Results for orders placed or performed in visit on 03/11/15  POCT CBC  Result Value Ref Range   WBC 6.4 4.6 - 10.2 K/uL   Lymph, poc 2.0 0.6 - 3.4   POC LYMPH PERCENT 30.7 10 - 50 %L   MID (cbc) 0.7 0 - 0.9   POC MID % 11.7 0 - 12 %M   POC Granulocyte 3.7 2 - 6.9   Granulocyte percent 57.6 37 - 80 %G   RBC 5.53 4.69 - 6.13 M/uL   Hemoglobin 15.1 14.1 - 18.1 g/dL   HCT, POC 16.1 09.6 - 53.7 %   MCV 83.5 80 - 97 fL   MCH, POC 27.3 27 - 31.2 pg   MCHC 32.7 31.8 - 35.4 g/dL   RDW, POC 04.5 %   Platelet Count, POC 209 142 - 424 K/uL   MPV 6.3 0 - 99.8 fL    Assessment & Plan:   1. Dental abscess   2. Acute maxillary sinusitis, recurrence not specified   3. Impetigo   4. Lymphadenopathy of left cervical region    Recheck in 4d w/ me due to severity of lyphadenopathy.  Wet warm compress to impetigo lesions followed by top bacitracin.  Pt with amox and cefazolin allergies so try clinda but reviewed side effects inc need for excellent hand hygiene due to c. Diff risk.  However, I think this is the best option to cover dental  abscess and topical impetigo in light of abx allergies. There is a possibility that his lesions could be mrsa due to lesion in nose, possibility could be shingles since lesions left V3 region though appearance atypical with no underlying or contiguous erythematous macular areas and no sig pain of rash. Pt has continued f/u with his dentist.  Orders Placed This Encounter  Procedures  . POCT CBC    Meds ordered this encounter  Medications  . clindamycin (CLEOCIN) 300 MG capsule    Sig: Take 1 capsule (300 mg total) by mouth 3 (three) times daily.    Dispense:  30 capsule    Refill:  0  .  mupirocin ointment (BACTROBAN) 2 %    Sig: Apply 1 application topically 4 (four) times daily.    Dispense:  30 g    Refill:  1    I personally performed the services described in this documentation, which was scribed in my presence. The recorded information has been reviewed and considered, and addended by me as needed.  Louis Sorenson, MD MPH    By signing my name below, I, Nadim Abuhashem, attest that this documentation has been prepared under the direction and in the presence of Louis Sorenson, MD.  Electronically Signed: Conchita Paris, medical scribe. 03/11/2015, 1:05 PM.

## 2015-03-14 ENCOUNTER — Ambulatory Visit (INDEPENDENT_AMBULATORY_CARE_PROVIDER_SITE_OTHER): Payer: BLUE CROSS/BLUE SHIELD | Admitting: Family Medicine

## 2015-03-14 VITALS — BP 112/62 | HR 78 | Temp 98.5°F | Resp 18 | Ht 72.0 in | Wt 231.0 lb

## 2015-03-14 DIAGNOSIS — L988 Other specified disorders of the skin and subcutaneous tissue: Secondary | ICD-10-CM | POA: Diagnosis not present

## 2015-03-14 DIAGNOSIS — L0100 Impetigo, unspecified: Secondary | ICD-10-CM | POA: Diagnosis not present

## 2015-03-14 DIAGNOSIS — B029 Zoster without complications: Secondary | ICD-10-CM

## 2015-03-14 DIAGNOSIS — R238 Other skin changes: Secondary | ICD-10-CM

## 2015-03-14 NOTE — Patient Instructions (Addendum)
Increase the clindamycin to 4 times a day instead of 3.  Hibiclens soap is effective against topical bacteria that is resistant to antibiotics so wash with this all over your whole face and rash every night.  Continue the topical antibiotic to all lesions after warm compress.  If worsening at all, please call or return to clinic.  Impetigo, Adult Impetigo is an infection of the skin. It commonly occurs in young children, but it can also occur in adults. The infection causes itchy blisters and sores that produce brownish-yellow fluid. As the fluid dries, it forms a thick, honey-colored crust. These skin changes usually occur on the face but can also affect other areas of the body. Impetigo usually goes away in 7-10 days with treatment. CAUSES Impetigo is caused by two types of bacteria. It may be caused by staphylococci or streptococci bacteria. These bacteria cause impetigo when they get under the surface of the skin. This often happens after some damage to the skin, such as damage from:  Cuts, scrapes, or scratches.  Insect bites, especially when you scratch the area of a bite.  Chickenpox or other illnesses that cause open skin sores.  Nail biting or chewing. Impetigo is contagious and can spread easily from one person to another. This may occur through close skin contact or by sharing towels, clothing, or other items with a person who has the infection. RISK FACTORS Some things that can increase the risk of getting this infection include:  Playing sports that include skin-to-skin contact with others.  Having a skin condition with open sores.  Having many skin cuts or scrapes.  Living in an area that has high humidity levels.  Having poor hygiene.  Having high levels of staphylococci in your nose. SIGNS AND SYMPTOMS Impetigo usually starts out as small blisters, often on the face. The blisters then break open and turn into tiny sores (lesions) with a yellow crust. In some cases, the  blisters cause itching or burning. With scratching, irritation, or lack of treatment, these small lesions may get larger. Scratching can also cause impetigo to spread to other parts of the body. The bacteria can get under the fingernails and spread when you touch another area of your skin. Other possible symptoms include:  Larger blisters.  Pus.  Swollen lymph glands. DIAGNOSIS This condition is usually diagnosed during a physical exam. A skin sample or sample of fluid from a blister may be taken for lab tests that involve growing bacteria (culture test). This can help confirm the diagnosis or help determine the best treatment. TREATMENT Mild impetigo can be treated with prescription antibiotic cream. Oral antibiotic medicine may be used in more severe cases. Medicines for itching may also be used. HOME CARE INSTRUCTIONS  Take medicines only as directed by your health care provider.  To help prevent impetigo from spreading to other body areas:  Keep your fingernails short and clean.  Do not scratch the blisters or sores.  Cover infected areas, if necessary, to keep from scratching.  Gently wash the infected areas with antibiotic soap and water.  Soak crusted areas in warm, soapy water using antibiotic soap.  Gently rub the areas to remove crusts. Do not scrub.  Wash your hands often to avoid spreading this infection.  Stay home until you have used an antibiotic cream for 48 hours (2 days) or an oral antibiotic medicine for 24 hours (1 day). You should only return to work and activities with other people if your skin shows significant  improvement. PREVENTION  To keep the infection from spreading:  Stay home until you have used an antibiotic cream for 48 hours or an oral antibiotic for 24 hours.  Wash your hands often.  Do not engage in skin-to-skin contact with other people while you have still have blisters.  Do not share towels, washcloths, or bedding with others while you  have the infection. SEEK MEDICAL CARE IF:  You develop more blisters or sores despite treatment.  Other family members get sores.  Your skin sores are not improving after 48 hours of treatment.  You have a fever. SEEK IMMEDIATE MEDICAL CARE IF:  You see spreading redness or swelling of the skin around your sores.  You see red streaks coming from your sores.  You develop a sore throat.   This information is not intended to replace advice given to you by your health care provider. Make sure you discuss any questions you have with your health care provider.   Document Released: 06/16/2014 Document Reviewed: 06/16/2014 Elsevier Interactive Patient Education [employer]

## 2015-03-14 NOTE — Progress Notes (Addendum)
Subjective:    Patient ID: Louis Woodward, male    DOB: 11-05-1990, 24 y.o.   MRN: [id number] This chart was scribed for [name], MD by Littie Deeds, Medical Scribe. This patient was seen in Room 11 and the patient's care was started at 12:00 PM.   Chief Complaint  Patient presents with  . Follow-up    face race     HPI HPI Comments: Louis Woodward is a 24 y.o. male who presents to the [facility] for a follow-up. Patient was seen by me 3 days ago with complaints of rash to his face, facial swelling, neck swelling, intermittent mass to back of head, sore throat, and gum bleeding; he was diagnosed with dental abscess, impetigo, lymphadenopathy of left cervical region, and acute maxillary sinusitis. His dental pain and adenopathy have significantly improved. He has been applying warm compresses and mupirocin to his face; he has not used the mupirocin yet today. He has also developed a productive cough of clear phlegm. He also notes having some neck stiffness at night. Overall, his symptoms are worse at night. Patient denies fever, chills, diarrhea, and difficulty swallowing.   Past Medical History  Diagnosis Date  . Asthma   . Allergy    Current Outpatient Prescriptions on File Prior to Visit  Medication Sig Dispense Refill  . clindamycin (CLEOCIN) 300 MG capsule Take 1 capsule (300 mg total) by mouth 3 (three) times daily. 30 capsule 0  . mupirocin ointment (BACTROBAN) 2 % Apply 1 application topically 4 (four) times daily. (Patient not taking: Reported on 03/14/2015) 30 g 1   No current facility-administered medications on file prior to visit.   Allergies  Allergen Reactions  . Risperdal [Risperidone] Other (See Comments)    Tongue swelling and lip twitching after a couple of days  . Amoxicillin Er Rash  . Cefazolin Rash     Review of Systems  Constitutional: Positive for activity change and fatigue. Negative for fever, chills, diaphoresis, appetite change and  unexpected weight change.  HENT: Positive for dental problem, facial swelling, postnasal drip and sore throat. Negative for rhinorrhea, sinus pressure, trouble swallowing and voice change.   Respiratory: Positive for cough.   Gastrointestinal: Negative for vomiting and diarrhea.  Musculoskeletal: Positive for myalgias and neck stiffness. Negative for back pain, joint swelling, gait problem and neck pain.  Skin: Positive for rash.  Neurological: Negative for dizziness and facial asymmetry.  Hematological: Positive for adenopathy. Does not bruise/bleed easily.  Psychiatric/Behavioral: Positive for sleep disturbance.       Objective:  BP 112/62 mmHg  Pulse 78  Temp(Src) 98.5 F (36.9 C) (Oral)  Resp 18  Ht 6' (1.829 m)  Wt 231 lb (104.781 kg)  BMI 31.32 kg/m2  SpO2 97%  Physical Exam  Constitutional: He is oriented to person, place, and time. He appears well-developed and well-nourished. No distress.  HENT:  Head: Normocephalic and atraumatic.  Right Ear: Tympanic membrane is erythematous and retracted.  Left Ear: Tympanic membrane is erythematous.  Nose: Mucosal edema present.  Mouth/Throat: Oropharynx is clear and moist. No oropharyngeal exudate, posterior oropharyngeal edema or posterior oropharyngeal erythema.  Left lower posterior dental abscess behind his molar that is significantly improved with mild erythema. Edema and exudates resolved.  Eyes: Pupils are equal, round, and reactive to light.  Neck: Neck supple.  Significant decrease in cervical and submandibular adenopathy.   Cardiovascular: Normal rate, regular rhythm, S1 normal, S2 normal and normal heart sounds.   No  murmur heard. Pulmonary/Chest: Effort normal and breath sounds normal. No respiratory distress. He has no wheezes. He has no rales.  Clear to auscultation bilaterally.   Musculoskeletal: He exhibits no edema.  Lymphadenopathy:    He has cervical adenopathy.  Neurological: He is alert and oriented to  person, place, and time. No cranial nerve deficit.  Skin: Skin is warm and dry. No rash noted.  2 cm x 3 cm erythematous area with numerous pustules and some excoriated ulcerations spreading anteriorly towards chin on the left submandibular area and left naris.  Psychiatric: He has a normal mood and affect. His behavior is normal.  Nursing note and vitals reviewed.         Assessment & Plan:   1. Impetigo   2. Bullous lesion   Concerning the skin rash has not improved on clinda and bactroban - cont warm moist heat qid followed by bactroban.  Fortunately, his dental abscess has improved sig - on day 3 of clinda - so increase from tid to qid.  Orders Placed This Encounter  Procedures  . Wound culture  . rflxH. simplex/VZ Virus Cult/Dif   ADDENDUM: HSV clx +  - rash is herpes zoster - not impetigo. Shingles was lower on my ddx as he was not copmplaining of sig pain but rash is confined to Left v3 dermatome.  Start valtrex if rash is still present though at this point he has had rash for >1 1/2 wks.  I personally performed the services described in this documentation, which was scribed in my presence. The recorded information has been reviewed and considered, and addended by me as needed.  [name], MD MPH   By signing my name below, I, Littie Deeds, attest that this documentation has been prepared under the direction and in the presence of [name], MD.  Electronically Signed: Littie Deeds, Medical Scribe. 03/14/2015. 12:00 PM.

## 2015-03-17 LAB — WOUND CULTURE
Gram Stain: NONE SEEN
Gram Stain: NONE SEEN

## 2015-03-19 LAB — RFLXH. SIMPLEX/VZ VIRUS CULT/DIF

## 2015-03-20 MED ORDER — VALACYCLOVIR HCL 1 G PO TABS: 1000.0000 mg | ORAL_TABLET | Freq: Three times a day (TID) | ORAL | Status: DC

## 2015-03-26 ENCOUNTER — Telehealth: Payer: Self-pay

## 2015-03-26 NOTE — Telephone Encounter (Signed)
Pt is really curious about her lab results, [facility] but really would like a call back to find out what type of infection she had and about her Lab results. Please call [phone]

## 2015-03-26 NOTE — Telephone Encounter (Signed)
See labs 

## 2015-03-26 NOTE — Telephone Encounter (Signed)
Erin called pt but no answer and voicemail left.

## 2015-07-24 ENCOUNTER — Encounter: Payer: Self-pay | Admitting: Family Medicine

## 2015-07-24 ENCOUNTER — Ambulatory Visit (INDEPENDENT_AMBULATORY_CARE_PROVIDER_SITE_OTHER): Payer: BLUE CROSS/BLUE SHIELD | Admitting: Family Medicine

## 2015-07-24 VITALS — BP 120/76 | HR 109 | Ht 72.0 in | Wt 239.0 lb

## 2015-07-24 DIAGNOSIS — K529 Noninfective gastroenteritis and colitis, unspecified: Secondary | ICD-10-CM

## 2015-07-24 DIAGNOSIS — F3110 Bipolar disorder, current episode manic without psychotic features, unspecified: Secondary | ICD-10-CM | POA: Diagnosis not present

## 2015-07-24 NOTE — Progress Notes (Signed)
   Subjective:    Patient ID: Louis Woodward, male    DOB: [birth year], 25 y.o.   MRN: [id number]  HPI He is here for evaluation of a six-day history of abdominal pain and diarrhea but no fever, chills, nausea or vomiting. He has had no recent travel, does not drink well water. One else around him is also been sick. Review his record indicates he is presently on no medications but does have a history of bipolar disorder. He does see psychiatry regularly and alluded to the fact that the bipolar disorder might be mislabeled and his problem was more acting out at a younger age.   Review of Systems     Objective:   Physical Exam Alert and in no distress. Tympanic membranes and canals are normal. Pharyngeal area is normal. Neck is supple without adenopathy or thyromegaly. Cardiac exam shows a regular sinus rhythm without murmurs or gallops. Lungs are clear to auscultation.Abdominal exam shows active bowel sounds without masses or tenderness        Assessment & Plan:  Acute gastroenteritis  Bipolar disorder, manic phase (HCC) Recommend he use up to 8 Imodium per day and eat anything he feels comfortable eating. Reassured him that this should go away with time. Continued difficulty, he will call me. Also he is to have a psychiatrist send me a note concerning any potential change in his psychiatric diagnosis.

## 2015-07-24 NOTE — Patient Instructions (Signed)
You can take up to 8 Imodium a day to help with the diarrhea. Eat whatever you're comfortable eating but if you know certain foods irritate your stomach and stay away

## 2015-07-26 ENCOUNTER — Telehealth: Payer: Self-pay | Admitting: Family Medicine

## 2015-07-26 NOTE — Telephone Encounter (Signed)
Pt called to see if Dr. Susann Givens had reviewed his phone call.  He states his fever is higher than it was when he was here.  It is now 102.6.  His stomach is tight and hurting.  The only thing now coming out when he goes to the bathroom is a foul stomach acid.  He states it smells so bad he has to put a towel over his face.  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

## 2015-07-26 NOTE — Telephone Encounter (Signed)
Spoke with Dr. Redmond School, he was 2 stool spec. Ovum Parasites culture.  Rx written.  Called patient, explained everything to him.  Advised once we receive results, Dr. Redmond School out know what to treat.  He will send his mom to pick up kit.

## 2015-07-26 NOTE — Telephone Encounter (Signed)
Pt said he hass trid the OTC med that Dr Susann Givens asked his to use and he is still having diarrhea. What should he do?

## 2015-07-27 ENCOUNTER — Telehealth: Payer: Self-pay

## 2015-07-27 ENCOUNTER — Encounter (HOSPITAL_COMMUNITY): Payer: Self-pay

## 2015-07-27 ENCOUNTER — Telehealth: Payer: Self-pay | Admitting: Family Medicine

## 2015-07-27 ENCOUNTER — Emergency Department (HOSPITAL_COMMUNITY)
Admission: EM | Admit: 2015-07-27 | Discharge: 2015-07-27 | Disposition: A | Discharge status: Home / Self Care | Payer: BLUE CROSS/BLUE SHIELD | Source: Home / Self Care | Attending: Family Medicine | Admitting: Family Medicine

## 2015-07-27 DIAGNOSIS — R197 Diarrhea, unspecified: Secondary | ICD-10-CM | POA: Diagnosis not present

## 2015-07-27 DIAGNOSIS — E860 Dehydration: Secondary | ICD-10-CM | POA: Diagnosis not present

## 2015-07-27 MED ORDER — SODIUM CHLORIDE 0.9 % IV SOLN
Freq: Once | INTRAVENOUS | Status: AC
  Administered 2015-07-27: 20:00:00 via INTRAVENOUS

## 2015-07-27 MED ORDER — DIPHENOXYLATE-ATROPINE 2.5-0.025 MG PO TABS: 1.0000 | ORAL_TABLET | Freq: Four times a day (QID) | ORAL | Status: AC | PRN

## 2015-07-27 MED ORDER — ONDANSETRON HCL 4 MG PO TABS: 4.0000 mg | ORAL_TABLET | Freq: Four times a day (QID) | ORAL | Status: DC

## 2015-07-27 NOTE — Discharge Instructions (Signed)
Dehydration, Adult Dehydration means your body does not have as much fluid or water as it needs. It happens when you take in less fluid than you lose. Your kidneys, brain, and heart will not work properly without the right amount of fluids.  Dehydration can range from mild to severe. It should be treated right away to help prevent it from becoming severe. HOME CARE  Drink enough fluid to keep your pee (urine) clear or pale yellow.  Drink water or fluid slowly by taking small sips. You can also try sucking on ice cubes.  Have food or drinks that contain electrolytes. Examples include bananas and sports drinks.  Take over-the-counter and prescription medicines only as told by your doctor.  Prepare oral rehydration solution (ORS) according to the instructions that came with it. Take sips of ORS every 5 minutes until your pee returns to normal.  If you are throwing up (vomiting) or have watery poop (diarrhea), keep trying to drink water, ORS, or both.  If you have watery poop, avoid:  Drinks with caffeine.  Fruit juice.  Milk.  Carbonated soft drinks.  Do not take salt tablets. This can lead to having too much sodium in your body (hypernatremia). GET HELP IF:  You cannot eat or drink without throwing up.  You have had mild watery poop for longer than 24 hours.  You have a fever. GET HELP RIGHT AWAY IF:   You have very strong thirst.  You have very bad watery poop.  You have not peed in 6-8 hours, or you have peed only a small amount of very dark pee.  You have shriveled skin.  You are dizzy, confused, or both.   This information is not intended to replace advice given to you by your health care provider. Make sure you discuss any questions you have with your health care provider.   Document Released: 03/22/2009 Document Revised: 02/14/2015 Document Reviewed: 10/11/2014 Elsevier Interactive Patient Education 2016 Elsevier Inc.   Diarrhea Diarrhea is frequent loose  and watery bowel movements. It can cause you to feel weak and dehydrated. Dehydration can cause you to become tired and thirsty, have a dry mouth, and have decreased urination that often is dark yellow. Diarrhea is a sign of another problem, most often an infection that will not last long. In most cases, diarrhea typically lasts 2-3 days. However, it can last longer if it is a sign of something more serious. It is important to treat your diarrhea as directed by your caregiver to lessen or prevent future episodes of diarrhea. CAUSES  Some common causes include:  Gastrointestinal infections caused by viruses, bacteria, or parasites.  Food poisoning or food allergies.  Certain medicines, such as antibiotics, chemotherapy, and laxatives.  Artificial sweeteners and fructose.  Digestive disorders. HOME CARE INSTRUCTIONS  Ensure adequate fluid intake (hydration): Have 1 cup (8 oz) of fluid for each diarrhea episode. Avoid fluids that contain simple sugars or sports drinks, fruit juices, whole milk products, and sodas. Your urine should be clear or pale yellow if you are drinking enough fluids. Hydrate with an oral rehydration solution that you can purchase at pharmacies, retail stores, and online. You can prepare an oral rehydration solution at home by mixing the following ingredients together:   - tsp table salt.   tsp baking soda.   tsp salt substitute containing potassium chloride.  1  tablespoons sugar.  1 L (34 oz) of water.  Certain foods and beverages may increase the speed at which food   moves through the gastrointestinal (GI) tract. These foods and beverages should be avoided and include:  Caffeinated and alcoholic beverages.  High-fiber foods, such as raw fruits and vegetables, nuts, seeds, and whole grain breads and cereals.  Foods and beverages sweetened with sugar alcohols, such as xylitol, sorbitol, and mannitol.  Some foods may be well tolerated and may help thicken stool  including:  Starchy foods, such as rice, toast, pasta, low-sugar cereal, oatmeal, grits, baked potatoes, crackers, and bagels.  Bananas.  Applesauce.  Add probiotic-rich foods to help increase healthy bacteria in the GI tract, such as yogurt and fermented milk products.  Wash your hands well after each diarrhea episode.  Only take over-the-counter or prescription medicines as directed by your caregiver.  Take a warm bath to relieve any burning or pain from frequent diarrhea episodes. SEEK IMMEDIATE MEDICAL CARE IF:   You are unable to keep fluids down.  You have persistent vomiting.  You have blood in your stool, or your stools are black and tarry.  You do not urinate in 6-8 hours, or there is only a small amount of very dark urine.  You have abdominal pain that increases or localizes.  You have weakness, dizziness, confusion, or light-headedness.  You have a severe headache.  Your diarrhea gets worse or does not get better.  You have a fever or persistent symptoms for more than 2-3 days.  You have a fever and your symptoms suddenly get worse. MAKE SURE YOU:   Understand these instructions.  Will watch your condition.  Will get help right away if you are not doing well or get worse.   This information is not intended to replace advice given to you by your health care provider. Make sure you discuss any questions you have with your health care provider.   Document Released: 05/16/2002 Document Revised: 06/16/2014 Document Reviewed: 02/01/2012 Elsevier Interactive Patient Education 2016 Elsevier Inc.  

## 2015-07-27 NOTE — Telephone Encounter (Signed)
Louis Woodward with [facility] called to get diagnosis for patients stool samples.  Tried to call back and unable to get through, no voicemail available.

## 2015-07-27 NOTE — Telephone Encounter (Signed)
Pt called concerned that [facility] told him that they will not be "running test on his stool samples" until next week and he will not have results until next week. Pt said he feels awful and do not feel that he can wait that long.

## 2015-07-27 NOTE — Telephone Encounter (Signed)
Spoke with patient advised him the stool results may take a few days to come back.  Advised him to try to keep liquids in him.  Also recommended the BRAT diet.  Patient understands.

## 2015-07-27 NOTE — ED Provider Notes (Signed)
CSN: [id number]     Arrival date & time 07/27/15  1908 History   First MD Initiated Contact with Patient 07/27/15 1951     Chief Complaint  Patient presents with  . Diarrhea   (Consider location/radiation/quality/duration/timing/severity/associated sxs/prior Treatment) HPI Diarrhea for 11 days. Was seen by his PCP Wednesday had stool cultures done has suggested over-the-counter Imodium for symptoms. Sensation states that he is not getting any better at this time. He is drinking about 2 gallons of Gatorade but still feels dehydrated. He states that he is having a large output of watery brown diarrhea. No one else in his household is sick. Patient denies pain at this time. Previous symptoms of this nature. Denies blood in his stools. Past Medical History  Diagnosis Date  . Asthma   . Allergy    Past Surgical History  Procedure Laterality Date  . Joint replacement     History reviewed. No pertinent family history. Social History  Substance Use Topics  . Smoking status: Never Smoker   . Smokeless tobacco: Current User    Types: Snuff  . Alcohol Use: 1.2 oz/week    0 Standard drinks or equivalent, 2 Cans of beer per week     Comment: none since September    Review of Systems Diarrhea 11 days his lightheadedness dizziness, chest pain shortness of breath. Allergies  Risperdal; Amoxicillin er; and Cefazolin  Home Medications   Prior to Admission medications   Medication Sig Start Date End Date Taking? Authorizing Provider  Loperamide HCl (IMODIUM PO) Take by mouth.   Yes Historical Provider, MD  Simethicone (GAS-X PO) Take by mouth.   Yes Historical Provider, MD   Meds Ordered and Administered this Visit   Medications  0.9 %  sodium chloride infusion (not administered)    BP 138/88 mmHg  Pulse 105  Temp(Src) 99.1 F (37.3 C) (Oral)  SpO2 96% No data found.   Physical Exam  Constitutional: He is oriented to person, place, and time. He appears well-developed and  well-nourished. No distress.  HENT:  Head: Normocephalic and atraumatic.  Right Ear: External ear normal.  Left Ear: External ear normal.  Mouth/Throat: Mucous membranes are dry.  Eyes: Conjunctivae are normal.  Cardiovascular: Normal rate, regular rhythm and normal heart sounds.   Pulmonary/Chest: Effort normal.  Abdominal: Soft. There is no tenderness. There is no rebound and no guarding.  Bowel sounds are bit hyperactive.  Musculoskeletal: Normal range of motion.  Neurological: He is alert and oriented to person, place, and time.  Skin: Skin is warm.  Psychiatric: He has a normal mood and affect. His behavior is normal.    ED Course  Procedures (including critical care time)  Labs Review Labs Reviewed - No data to display  Imaging Review No results found.   Visual Acuity Review  Right Eye Distance:   Left Eye Distance:   Bilateral Distance:    Right Eye Near:   Left Eye Near:    Bilateral Near:        Treatment plan is to suggest 1 L IV fluids to see if this helps and makes her feel any better. Patient states he is feeling better after a liter of fluids. MDM   1. Dehydration, mild   2. Diarrhea, unspecified type     Patient is advised to continue home symptomatic treatment. Prescription for Zofran, Lomotil sent pharmacy patient has indicated. Patient is advised that if there are new or worsening symptoms or attend the emergency department, or contact  primary care provider. Instructions of care provided discharged home in stable condition. Return to work/school note provided.  THIS NOTE WAS GENERATED USING A VOICE RECOGNITION SOFTWARE PROGRAM. ALL REASONABLE EFFORTS  WERE MADE TO PROOFREAD THIS DOCUMENT FOR ACCURACY.      Tharon Aquas, [place] 07/27/15 2034

## 2015-07-27 NOTE — ED Notes (Addendum)
Patient states he has had stomach pain and diarrhea  x10 days Patient has been taking Tylenol and Imodium  No acute distress Mom is at bedside

## 2015-07-31 ENCOUNTER — Telehealth: Payer: Self-pay | Admitting: Family Medicine

## 2015-07-31 ENCOUNTER — Telehealth: Payer: Self-pay

## 2015-07-31 NOTE — Telephone Encounter (Signed)
Pt returned call to nurse. Can reach him at [phone]

## 2015-07-31 NOTE — Telephone Encounter (Signed)
Left message for pt to call me back 

## 2015-07-31 NOTE — Telephone Encounter (Signed)
Called to see how patient was feeling.  Left message informing him stool results are not back yet.

## 2015-08-01 NOTE — Telephone Encounter (Signed)
Spoke to patient and he states that he is feeling alittle bit better yesterday and today. He still has diarrhea but its not as watery. He has only had diarrheaonce today which was before work and had it once last night. He has slept through the night without getting up and having stomach pains. His stomach still hurts alittle. Pt was notified that his results for stool were negative.

## 2015-08-01 NOTE — Telephone Encounter (Signed)
Let him know that if he keeps having difficulty with diarrhea, I will send him to GI

## 2015-08-01 NOTE — Telephone Encounter (Signed)
Pt notified and will let us know

## 2015-08-02 ENCOUNTER — Encounter: Payer: Self-pay | Admitting: Family Medicine

## 2015-08-20 ENCOUNTER — Ambulatory Visit (INDEPENDENT_AMBULATORY_CARE_PROVIDER_SITE_OTHER): Payer: BLUE CROSS/BLUE SHIELD

## 2015-08-20 ENCOUNTER — Ambulatory Visit (INDEPENDENT_AMBULATORY_CARE_PROVIDER_SITE_OTHER): Payer: BLUE CROSS/BLUE SHIELD | Admitting: Family Medicine

## 2015-08-20 VITALS — BP 120/66 | HR 87 | Temp 98.2°F | Resp 20 | Ht 72.0 in | Wt 233.8 lb

## 2015-08-20 DIAGNOSIS — A047 Enterocolitis due to Clostridium difficile: Secondary | ICD-10-CM

## 2015-08-20 DIAGNOSIS — A0472 Enterocolitis due to Clostridium difficile, not specified as recurrent: Secondary | ICD-10-CM

## 2015-08-20 DIAGNOSIS — R197 Diarrhea, unspecified: Secondary | ICD-10-CM

## 2015-08-20 DIAGNOSIS — A09 Infectious gastroenteritis and colitis, unspecified: Secondary | ICD-10-CM

## 2015-08-20 DIAGNOSIS — K529 Noninfective gastroenteritis and colitis, unspecified: Secondary | ICD-10-CM

## 2015-08-20 LAB — POCT CBC
GRANULOCYTE PERCENT: 75.3 % (ref 37–80)
HEMATOCRIT: 43.8 % (ref 43.5–53.7)
Hemoglobin: 15.9 g/dL (ref 14.1–18.1)
Lymph, poc: 2.5 (ref 0.6–3.4)
MCH, POC: 30.4 pg (ref 27–31.2)
MCHC: 36.4 g/dL — AB (ref 31.8–35.4)
MCV: 83.6 fL (ref 80–97)
MID (CBC): 0.6 (ref 0–0.9)
MPV: 7.2 fL (ref 0–99.8)
POC GRANULOCYTE: 9.6 — AB (ref 2–6.9)
POC LYMPH %: 19.8 % (ref 10–50)
POC MID %: 4.9 %M (ref 0–12)
Platelet Count, POC: 242 10*3/uL (ref 142–424)
RBC: 5.24 M/uL (ref 4.69–6.13)
RDW, POC: 13.2 %
WBC: 12.7 10*3/uL — AB (ref 4.6–10.2)

## 2015-08-20 LAB — POCT URINALYSIS DIP (MANUAL ENTRY)
BILIRUBIN UA: NEGATIVE
Blood, UA: NEGATIVE
Glucose, UA: NEGATIVE
Ketones, POC UA: NEGATIVE
Leukocytes, UA: NEGATIVE
Nitrite, UA: NEGATIVE
PH UA: 6
PROTEIN UA: NEGATIVE
Spec Grav, UA: 1.01
UROBILINOGEN UA: 0.2

## 2015-08-20 LAB — COMPREHENSIVE METABOLIC PANEL
ALBUMIN: 4.6 g/dL (ref 3.6–5.1)
ALK PHOS: 86 U/L (ref 40–115)
ALT: 34 U/L (ref 9–46)
AST: 16 U/L (ref 10–40)
BILIRUBIN TOTAL: 0.9 mg/dL (ref 0.2–1.2)
BUN: 10 mg/dL (ref 7–25)
CALCIUM: 9.8 mg/dL (ref 8.6–10.3)
CO2: 28 mmol/L (ref 20–31)
Chloride: 100 mmol/L (ref 98–110)
Creat: 0.76 mg/dL (ref 0.60–1.35)
Glucose, Bld: 81 mg/dL (ref 65–99)
POTASSIUM: 4.3 mmol/L (ref 3.5–5.3)
Sodium: 137 mmol/L (ref 135–146)
TOTAL PROTEIN: 7 g/dL (ref 6.1–8.1)

## 2015-08-20 LAB — POC MICROSCOPIC URINALYSIS (UMFC): Mucus: ABSENT

## 2015-08-20 LAB — TSH: TSH: 2.89 mIU/L (ref 0.40–4.50)

## 2015-08-20 LAB — C-REACTIVE PROTEIN: CRP: 2 mg/dL — ABNORMAL HIGH (ref <0.60)

## 2015-08-20 MED ORDER — CIPROFLOXACIN HCL 500 MG PO TABS: 500.0000 mg | ORAL_TABLET | Freq: Two times a day (BID) | ORAL | Status: DC

## 2015-08-20 NOTE — Patient Instructions (Addendum)
IF you received an x-ray today, you will receive an invoice from [facility]. Please contact [facility] at [phone] with questions or concerns regarding your invoice.   IF you received labwork today, you will receive an invoice from [facility]. Please contact [facility] at [phone] with questions or concerns regarding your invoice.   Our billing staff will not be able to assist you with questions regarding bills from these companies.  You will be contacted with the lab results as soon as they are available. The fastest way to get your results is to activate your My Chart account. Instructions are located on the last page of this paperwork. If you have not heard from us regarding the results in 2 weeks, please contact this office. Food Choices to Help Relieve Diarrhea, Adult When you have diarrhea, the foods you eat and your eating habits are very important. Choosing the right foods and drinks can help relieve diarrhea. Also, because diarrhea can last up to 7 days, you need to replace lost fluids and electrolytes (such as sodium, potassium, and chloride) in order to help prevent dehydration.  WHAT GENERAL GUIDELINES DO I NEED TO FOLLOW?  Slowly drink 1 cup (8 oz) of fluid for each episode of diarrhea. If you are getting enough fluid, your urine will be clear or pale yellow.  Eat starchy foods. Some good choices include white rice, white toast, pasta, low-fiber cereal, baked potatoes (without the skin), saltine crackers, and bagels.  Avoid large servings of any cooked vegetables.  Limit fruit to two servings per day. A serving is  cup or 1 small piece.  Choose foods with less than 2 g of fiber per serving.  Limit fats to less than 8 tsp (38 g) per day.  Avoid fried foods.  Eat foods that have probiotics in them. Probiotics can be found in certain dairy products.  Avoid foods and beverages that may increase the speed at which food moves  through the stomach and intestines (gastrointestinal tract). Things to avoid include:  High-fiber foods, such as dried fruit, raw fruits and vegetables, nuts, seeds, and whole grain foods.  Spicy foods and high-fat foods.  Foods and beverages sweetened with high-fructose corn syrup, honey, or sugar alcohols such as xylitol, sorbitol, and mannitol. WHAT FOODS ARE RECOMMENDED? Grains White rice. White, [place], or pita breads (fresh or toasted), including plain rolls, buns, or bagels. White pasta. Saltine, soda, or graham crackers. Pretzels. Low-fiber cereal. Cooked cereals made with water (such as cornmeal, farina, or cream cereals). Plain muffins. Matzo. Melba toast. Zwieback.  Vegetables Potatoes (without the skin). Strained tomato and vegetable juices. Most well-cooked and canned vegetables without seeds. Tender lettuce. Fruits Cooked or canned applesauce, apricots, cherries, fruit cocktail, grapefruit, peaches, pears, or plums. Fresh bananas, apples without skin, cherries, grapes, cantaloupe, grapefruit, peaches, oranges, or plums.  Meat and Other Protein Products Baked or boiled chicken. Eggs. Tofu. Fish. Seafood. Smooth peanut butter. Ground or well-cooked tender beef, ham, veal, lamb, pork, or poultry.  Dairy Plain yogurt, kefir, and unsweetened liquid yogurt. Lactose-free milk, buttermilk, or soy milk. Plain hard cheese. Beverages Sport drinks. Clear broths. Diluted fruit juices (except prune). Regular, caffeine-free sodas such as ginger ale. Water. Decaffeinated teas. Oral rehydration solutions. Sugar-free beverages not sweetened with sugar alcohols. Other Bouillon, broth, or soups made from recommended foods.  The items listed above may not be a complete list of recommended foods or beverages. Contact your dietitian for more options. WHAT FOODS ARE NOT RECOMMENDED? Grains Whole  grain, whole wheat, bran, or rye breads, rolls, pastas, crackers, and cereals. Wild or brown rice. Cereals  that contain more than 2 g of fiber per serving. Corn tortillas or taco shells. Cooked or dry oatmeal. Granola. Popcorn. Vegetables Raw vegetables. Cabbage, broccoli, Brussels sprouts, artichokes, baked beans, beet greens, corn, kale, legumes, peas, sweet potatoes, and yams. Potato skins. Cooked spinach and cabbage. Fruits Dried fruit, including raisins and dates. Raw fruits. Stewed or dried prunes. Fresh apples with skin, apricots, mangoes, pears, raspberries, and strawberries.  Meat and Other Protein Products Chunky peanut butter. Nuts and seeds. Beans and lentils. Tomasa Blase.  Dairy High-fat cheeses. Milk, chocolate milk, and beverages made with milk, such as milk shakes. Cream. Ice cream. Sweets and Desserts Sweet rolls, doughnuts, and sweet breads. Pancakes and waffles. Fats and Oils Butter. Cream sauces. Margarine. Salad oils. Plain salad dressings. Olives. Avocados.  Beverages Caffeinated beverages (such as coffee, tea, soda, or energy drinks). Alcoholic beverages. Fruit juices with pulp. Prune juice. Soft drinks sweetened with high-fructose corn syrup or sugar alcohols. Other Coconut. Hot sauce. Chili powder. Mayonnaise. Gravy. Cream-based or milk-based soups.  The items listed above may not be a complete list of foods and beverages to avoid. Contact your dietitian for more information. WHAT SHOULD I DO IF I BECOME DEHYDRATED? Diarrhea can sometimes lead to dehydration. Signs of dehydration include dark urine and dry mouth and skin. If you think you are dehydrated, you should rehydrate with an oral rehydration solution. These solutions can be purchased at pharmacies, retail stores, or online.  Drink -1 cup (120-240 mL) of oral rehydration solution each time you have an episode of diarrhea. If drinking this amount makes your diarrhea worse, try drinking smaller amounts more often. For example, drink 1-3 tsp (5-15 mL) every 5-10 minutes.  A general rule for staying hydrated is to drink 1-2 L  of fluid per day. Talk to your health care provider about the specific amount you should be drinking each day. Drink enough fluids to keep your urine clear or pale yellow.   This information is not intended to replace advice given to you by your health care provider. Make sure you discuss any questions you have with your health care provider.   Document Released: 08/16/2003 Document Revised: 06/16/2014 Document Reviewed: 04/18/2013 Elsevier Interactive Patient Education 2016 Elsevier Inc.   Colitis Colitis is inflammation of the colon. Colitis may last a short time (acute) or it may last a long time (chronic). CAUSES This condition may be caused by:  Viruses.  Bacteria.  Reactions to medicine.  Certain autoimmune diseases, such as Crohn disease or ulcerative colitis. SYMPTOMS Symptoms of this condition include:  Diarrhea.  Passing bloody or tarry stool.  Pain.  Fever.  Vomiting.  Tiredness (fatigue).  Weight loss.  Bloating.  Sudden increase in abdominal pain.  Having fewer bowel movements than usual. DIAGNOSIS This condition is diagnosed with a stool test or a blood test. You may also have other tests, including X-rays, a CT scan, or a colonoscopy. TREATMENT Treatment may include:  Resting the bowel. This involves not eating or drinking for a period of time.  Fluids that are given through an IV tube.  Medicine for pain and diarrhea.  Antibiotic medicines.  Cortisone medicines.  Surgery. HOME CARE INSTRUCTIONS Eating and Drinking  Follow instructions from your health care provider about eating or drinking restrictions.  Drink enough fluid to keep your urine clear or pale yellow.  Work with a Data processing manager to determine which foods cause  your condition to flare up.  Avoid foods that cause flare-ups.  Eat a well-balanced diet. Medicines  Take over-the-counter and prescription medicines only as told by your health care provider.  If you were  prescribed an antibiotic medicine, take it as told by your health care provider. Do not stop taking the antibiotic even if you start to feel better. General Instructions  Keep all follow-up visits as told by your health care provider. This is important. SEEK MEDICAL CARE IF:  Your symptoms do not go away.  You develop new symptoms. SEEK IMMEDIATE MEDICAL CARE IF:  You have a fever that does not go away with treatment.  You develop chills.  You have extreme weakness, fainting, or dehydration.  You have repeated vomiting.  You develop severe pain in your abdomen.  You pass bloody or tarry stool.   This information is not intended to replace advice given to you by your health care provider. Make sure you discuss any questions you have with your health care provider.   Document Released: 07/03/2004 Document Revised: 02/14/2015 Document Reviewed: 09/18/2014 Elsevier Interactive Patient Education [employer]

## 2015-08-20 NOTE — Progress Notes (Signed)
Subjective:  By signing my name below, I, Raven Small, attest that this documentation has been prepared under the direction and in the presence of [name], MD.  Electronically Signed: Andrew Au, ED Scribe. 08/20/2015. 2:29 PM.   Patient ID: Louis Woodward, male    DOB: 08/02/1990, 25 y.o.   MRN: [id number]  HPI Chief Complaint  Patient presents with  . Diarrhea    x 1 month  . Fever  . Abdominal Pain  . Back Pain    lower back side    HPI Comments: Louis Woodward is a 25 y.o. male who presents to the [facility] complaining of persistent diarrhea. Louis Woodward was seen 1 month prior by her PCP for abdominal pain and diarrhea. At that point her was diagnosed with acute gastroenteritis . He was thought to be manic with bipolar and advised to f/u with psych. He did not respond to imodium . He has Fever of 102.6 with epigastic pain. States diarrhea had a very foul odor. Several days later [facility] was [facility] due to persistent symptoms. He was given IV fluids at that time with minimal improvement and was put on zofran. 2/17 he had a negative omp done. Of note i last saw Louis Woodward for shingles outbreak over his face 5 months ago. He has not had any other imaging or labs with this episode.  Louis Woodward states he still has watery diarrhea with chunks of stool around 12pm and 8pm daily. He does reports dark black stools at one point. He reports 2 days ago her develop recurrent abdominal pain similar pain to when he was seen 1 month ago. He's also had abdominal bloating,  Flatulence, light headedness and low back pain with abdominal pain and diarrhea. He has cut back on spicy foods. He has noticed red in stools today but admits to eating red velvet cake last night. He has been taking loperamide without relief to symptoms. He also takes imodium without relief. No urinary symptoms, nausea, vomiting and weight changes. He denies family hx of GI problems.       Patient Active Problem List   Diagnosis Date Noted   . Allergic rhinitis, mild 09/09/2012  . History of asthma 09/09/2012  . Eczema 09/09/2012  . ADHD, predominantly hyperactive type 03/15/2012    Class: Chronic  . Bipolar disorder, manic phase (HCC) 03/11/2012    Class: Chronic  . Alcohol dependence (HCC) 03/06/2012    Class: Chronic   Past Medical History  Diagnosis Date  . Asthma   . Allergy    Past Surgical History  Procedure Laterality Date  . Joint replacement     Allergies  Allergen Reactions  . Risperdal [Risperidone] Other (See Comments)    Tongue swelling and lip twitching after a couple of days  . Amoxicillin Er Rash  . Cefazolin Rash   Prior to Admission medications   Medication Sig Start Date End Date Taking? Authorizing Provider  diphenoxylate-atropine (LOMOTIL) 2.5-0.025 MG tablet Take 1 tablet by mouth 4 (four) times daily as needed for diarrhea or loose stools. 07/27/15  Yes Tharon Aquas, PA  Simethicone (GAS-X PO) Take by mouth.   Yes Historical Provider, MD  Loperamide HCl (IMODIUM PO) Take by mouth. Reported on 08/20/2015    Historical Provider, MD  ondansetron (ZOFRAN) 4 MG tablet Take 1 tablet (4 mg total) by mouth every 6 (six) hours. Patient not taking: Reported on 08/20/2015 07/27/15   Tharon Aquas, PA   Social  History   Social History  . Marital Status: Single    Spouse Name: N/A  . Number of Children: N/A  . Years of Education: N/A   Occupational History  . Not on file.   Social History Main Topics  . Smoking status: Never Smoker   . Smokeless tobacco: Current User    Types: Snuff  . Alcohol Use: 1.2 oz/week    0 Standard drinks or equivalent, 2 Cans of beer per week     Comment: none since September  . Drug Use: No  . Sexual Activity: Yes    Birth Control/ Protection: Condom   Other Topics Concern  . Not on file   Social History Narrative   Review of Systems  Gastrointestinal: Positive for abdominal pain, diarrhea and abdominal distention. Negative for nausea and vomiting.    Genitourinary: Negative for dysuria, urgency, frequency and difficulty urinating.  Musculoskeletal: Positive for myalgias and back pain.  Neurological: Positive for dizziness and light-headedness.   Objective:   Physical Exam  Constitutional: He is oriented to person, place, and time. He appears well-developed and well-nourished. No distress.  HENT:  Head: Normocephalic and atraumatic.  Eyes: Conjunctivae and EOM are normal.  Neck: Neck supple.  Cardiovascular: Normal rate.   Pulmonary/Chest: Effort normal.  Abdominal: He exhibits distension ( mild). He exhibits no mass. Bowel sounds are increased. There is no hepatosplenomegaly. There is tenderness ( mildly).  Musculoskeletal: Normal range of motion.  Neurological: He is alert and oriented to person, place, and time.  Skin: Skin is warm and dry.  Psychiatric: He has a normal mood and affect. His behavior is normal.  Nursing note and vitals reviewed.  Filed Vitals:   08/20/15 1359  BP: 120/66  Pulse: 87  Temp: 98.2 F (36.8 C)  TempSrc: Oral  Resp: 20  Height: 6' (1.829 m)  Weight: 233 lb 12.8 oz (106.051 kg)  SpO2: 97%    LABS  Results for orders placed or performed in visit on 08/20/15  POCT CBC  Result Value Ref Range   WBC 12.7 (A) 4.6 - 10.2 K/uL   Lymph, poc 2.5 0.6 - 3.4   POC LYMPH PERCENT 19.8 10 - 50 %L   MID (cbc) 0.6 0 - 0.9   POC MID % 4.9 0 - 12 %M   POC Granulocyte 9.6 (A) 2 - 6.9   Granulocyte percent 75.3 37 - 80 %G   RBC 5.24 4.69 - 6.13 M/uL   Hemoglobin 15.9 14.1 - 18.1 g/dL   HCT, POC 96.043.8 45.443.5 - 53.7 %   MCV 83.6 80 - 97 fL   MCH, POC 30.4 27 - 31.2 pg   MCHC 36.4 (A) 31.8 - 35.4 g/dL   RDW, POC 09.813.2 %   Platelet Count, POC 242 142 - 424 K/uL   MPV 7.2 0 - 99.8 fL  POCT urinalysis dipstick  Result Value Ref Range   Color, UA yellow yellow   Clarity, UA clear clear   Glucose, UA negative negative   Bilirubin, UA negative negative   Ketones, POC UA negative negative   Spec Grav, UA  1.010    Blood, UA negative negative   pH, UA 6.0    Protein Ur, POC negative negative   Urobilinogen, UA 0.2    Nitrite, UA Negative Negative   Leukocytes, UA Negative Negative  POCT Microscopic Urinalysis ([facility])  Result Value Ref Range   WBC,UR,HPF,POC None None WBC/hpf   RBC,UR,HPF,POC None None RBC/hpf   Bacteria None  None, Too numerous to count   Mucus Absent Absent   Epithelial Cells, UR Per Microscopy Few (A) None, Too numerous to count cells/hpf   Orthostatic VS for the past 24 hrs:  BP- Lying Pulse- Lying BP- Sitting Pulse- Sitting BP- Standing at 0 minutes Pulse- Standing at 0 minutes  08/20/15 1511 123/83 mmHg 73 117/83 mmHg 83 124/86 mmHg 91   Negative orthostatic  XRAY  Dg Abd 2 Views  08/20/2015  CLINICAL DATA:  Abdominal pain and persistent diarrhea, diarrhea of presumed infectious origin, hyperactive bowel sounds over past month EXAM: ABDOMEN - 2 VIEW COMPARISON:  None FINDINGS: Suspected bowel wall thickening of the descending colon raising question of colitis. Remaining bowel gas pattern normal. No bowel dilatation or free intraperitoneal air. Osseous structures unremarkable. LEFT pelvic phleboliths noted. No definite urinary tract calcification. IMPRESSION: Suspected bowel wall thickening of the descending colon raising question of colitis. Electronically Signed   By: Ulyses Southward M.D.   On: 08/20/2015 15:58    Assessment & Plan:   1. Diarrhea of presumed infectious origin   Has been prolonged and xray shows inflammation so will have Louis Woodward collect stool samples then immed begin cipro.  Ok to to cont some imodium to slow losses then though careful to avoid overuse.  ADDENDUM: c. Diff + - stop cipro, start flagyl 500 tid. rx was prescrbed clindamycin 5 mos prior for dental/sinus infection due to several other abx allergies.  Orders Placed This Encounter  Procedures  . Clostridium Difficile by PCR    Order Specific Question:  Is your patient experiencing loose or  watery stools (3 or more in 24 hours)?    Answer:  Yes    Order Specific Question:  Has the patient received laxatives in the last 24 hours?    Answer:  No    Order Specific Question:  Has a negative Cdiff test resulted in the last 7 days?    Answer:  No  . GC/Chlamydia Probe Amp  . Stool Cells, WBC & RBC  . Ova and parasite examination  . Stool culture  . DG Abd 2 Views    Standing Status: Future     Number of Occurrences:      Standing Expiration Date: 08/19/2016    Order Specific Question:  Reason for Exam (SYMPTOM  OR DIAGNOSIS REQUIRED)    Answer:  persistent diarrhea and hyperactive bowel sounds over the past mo    Order Specific Question:  Preferred imaging location?    Answer:  External  . HIV antibody  . TSH  . Comprehensive metabolic panel  . Sedimentation Rate  . C-reactive protein  . Fecal lactoferrin  . Orthostatic vital signs  . POCT CBC  . POCT urinalysis dipstick  . POCT Microscopic Urinalysis ([facility])  . POC Hemoccult Bld/Stl (3-Cd Home Screen)    Standing Status: Future     Number of Occurrences:      Standing Expiration Date: 08/19/2016   I personally performed the services described in this documentation, which was scribed in my presence. The recorded information has been reviewed and considered, and addended by me as needed.  [name], MD MPH

## 2015-08-21 ENCOUNTER — Encounter: Payer: Self-pay | Admitting: Family Medicine

## 2015-08-21 LAB — HIV ANTIBODY (ROUTINE TESTING W REFLEX): HIV 1&2 Ab, 4th Generation: NONREACTIVE

## 2015-08-21 LAB — GC/CHLAMYDIA PROBE AMP
CT Probe RNA: NOT DETECTED
GC PROBE AMP APTIMA: NOT DETECTED

## 2015-08-21 LAB — SEDIMENTATION RATE: SED RATE: 1 mm/h (ref 0–15)

## 2015-08-22 LAB — STOOL CELLS, WBC & RBC
RBC/40X FIELD: 0
WBC/40X FIELD (SOL): 0

## 2015-08-22 LAB — FECAL LACTOFERRIN, QUANT: Lactoferrin: POSITIVE

## 2015-08-22 LAB — CLOSTRIDIUM DIFFICILE BY PCR: Toxigenic C. Difficile by PCR: DETECTED — CR

## 2015-08-23 LAB — OVA AND PARASITE EXAMINATION: OP: NONE SEEN

## 2015-08-23 MED ORDER — METRONIDAZOLE 500 MG PO TABS: 500.0000 mg | ORAL_TABLET | Freq: Three times a day (TID) | ORAL | Status: AC

## 2015-08-25 LAB — STOOL CULTURE

## 2020-06-18 ENCOUNTER — Emergency Department
Admission: EM | Admit: 2020-06-18 | Discharge: 2020-06-18 | Discharge status: Absent without leave | Payer: BLUE CROSS/BLUE SHIELD

## 2020-06-18 ENCOUNTER — Ambulatory Visit: Admit: 2020-06-18 | Discharge status: Absent without leave | Payer: BLUE CROSS/BLUE SHIELD

## 2020-06-19 ENCOUNTER — Other Ambulatory Visit: Payer: Self-pay

## 2020-06-19 ENCOUNTER — Encounter: Payer: Self-pay | Admitting: Family Medicine

## 2020-06-19 ENCOUNTER — Ambulatory Visit
Admission: EM | Admit: 2020-06-19 | Discharge: 2020-06-19 | Disposition: A | Discharge status: Home / Self Care | Payer: BC Managed Care – PPO | Attending: Family Medicine | Admitting: Family Medicine

## 2020-06-19 DIAGNOSIS — G510 Bell's palsy: Secondary | ICD-10-CM

## 2020-06-19 MED ORDER — PREDNISONE 10 MG PO TABS: 60.0000 mg | ORAL_TABLET | Freq: Every day | ORAL | 0 refills | Status: DC

## 2020-06-19 MED ORDER — PREDNISONE 10 MG PO TABS: 60.0000 mg | ORAL_TABLET | Freq: Every day | ORAL | 0 refills | Status: AC

## 2020-06-19 NOTE — ED Provider Notes (Signed)
Renaldo Fiddler    CSN: [id number] Arrival date & time: 06/19/20  0831      History   Chief Complaint No chief complaint on file.   HPI Merton Wadlow is a 30 y.o. male.   Pt is a 30 year old male that presents with numbness, tingling to the right side of his tongue and mild paralysis to the right side of his face.  Noticed this a few days ago.  No injuries to the face, oral swelling, trouble swallowing or breathing.  No fevers.  No recent illness.  No extremity weakness, numbness or tingling, trouble with speech or trouble with gait.     Past Medical History:  Diagnosis Date  . Allergy   . Asthma     Patient Active Problem List   Diagnosis Date Noted  . Allergic rhinitis, mild 09/09/2012  . History of asthma 09/09/2012  . Eczema 09/09/2012  . ADHD, predominantly hyperactive type 03/15/2012    Class: Chronic  . Bipolar disorder, manic phase (HCC) 03/11/2012    Class: Chronic  . Alcohol dependence (HCC) 03/06/2012    Class: Chronic    Past Surgical History:  Procedure Laterality Date  . JOINT REPLACEMENT         Home Medications    Prior to Admission medications   Medication Sig Start Date End Date Taking? Authorizing Provider  diphenoxylate-atropine (LOMOTIL) 2.5-0.025 MG tablet Take 1 tablet by mouth 4 (four) times daily as needed for diarrhea or loose stools. 07/27/15   Tharon Aquas, PA  Loperamide HCl (IMODIUM PO) Take by mouth. Reported on 08/20/2015    [provider]  metroNIDAZOLE (FLAGYL) 500 MG tablet Take 1 tablet (500 mg total) by mouth 3 (three) times daily. 08/23/15   Sherren Mocha, MD  predniSONE (DELTASONE) 10 MG tablet Take 6 tablets (60 mg total) by mouth daily for 7 days. 06/19/20 06/26/20  Dahlia Byes A, NP  Simethicone (GAS-X PO) Take by mouth.    [provider]    Family History History reviewed. No pertinent family history.  Social History Social History   Tobacco Use  . Smoking status: Never Smoker  .  Smokeless tobacco: Current User    Types: Snuff  Substance Use Topics  . Alcohol use: Yes    Alcohol/week: 2.0 standard drinks    Types: 2 Cans of beer per week    Comment: none since September  . Drug use: No     Allergies   Risperdal [risperidone], Amoxicillin er, and Cefazolin   Review of Systems Review of Systems   Physical Exam Triage Vital Signs ED Triage Vitals [06/19/20 0856]  Enc Vitals Group     BP (!) 158/86     Pulse Rate 87     Resp 16     Temp 98.3 F (36.8 C)     Temp Source Oral     SpO2 97 %     Weight      Height      Head Circumference      Peak Flow      Pain Score      Pain Loc      Pain Edu?      Excl. in GC?    No data found.  Updated Vital Signs BP (!) 158/86 (BP Location: Left Arm)   Pulse 87   Temp 98.3 F (36.8 C) (Oral)   Resp 16   SpO2 97%   Visual Acuity Right Eye Distance:  Left Eye Distance:   Bilateral Distance:    Right Eye Near:   Left Eye Near:    Bilateral Near:     Physical Exam Vitals and nursing note reviewed.  Constitutional:      Appearance: Normal appearance.  HENT:     Head: Normocephalic and atraumatic.     Nose: Nose normal.  Eyes:     Conjunctiva/sclera: Conjunctivae normal.  Pulmonary:     Effort: Pulmonary effort is normal.  Musculoskeletal:        General: Normal range of motion.     Cervical back: Normal range of motion.  Skin:    General: Skin is warm and dry.     Findings: No rash.  Neurological:     Mental Status: He is alert and oriented to person, place, and time.     Cranial Nerves: Cranial nerve deficit present.     Sensory: No sensory deficit.     Motor: No weakness.     Coordination: Coordination normal.     Gait: Gait normal.     Comments: Mild paralysis to the right face. Able to raise eyebrow and close eye with mild force.  Mild right facial droop Sensation intact.    Psychiatric:        Mood and Affect: Mood normal.      UC Treatments / Results  Labs (all labs  ordered are listed, but only abnormal results are displayed) Labs Reviewed - No data to display  EKG   Radiology No results found.  Procedures Procedures (including critical care time)  Medications Ordered in UC Medications - No data to display  Initial Impression / Assessment and Plan / UC Course  I have reviewed the triage vital signs and the nursing notes.  Pertinent labs & imaging results that were available during my care of the patient were reviewed by me and considered in my medical decision making (see chart for details).     Bell's palsy Exam consistent with this.  Patient symptoms are pretty mild Grade II on the House Brackmann scale  Will treat with prednisone daily x1 week. Follow up as needed for continued or worsening symptoms  Final Clinical Impressions(s) / UC Diagnoses   Final diagnoses:  Bell's palsy     Discharge Instructions     This is Bell's palsy.  I have printed out some information in her discharge instructions about this. We are treating this with 60 mg of prednisone daily for the next week.  Take this with food. Follow up as needed for continued or worsening symptoms     ED Prescriptions    Medication Sig Dispense Auth. Provider   predniSONE (DELTASONE) 10 MG tablet  (Status: Discontinued) Take 6 tablets (60 mg total) by mouth daily for 7 days. 42 tablet Jonnette Nuon A, NP   predniSONE (DELTASONE) 10 MG tablet Take 6 tablets (60 mg total) by mouth daily for 7 days. 42 tablet Jancy Sprankle A, NP     PDMP not reviewed this encounter.   Janace Aris, NP 06/19/20 [phone]

## 2020-06-19 NOTE — Discharge Instructions (Signed)
This is Bell's palsy.  I have printed out some information in her discharge instructions about this. We are treating this with 60 mg of prednisone daily for the next week.  Take this with food. Follow up as needed for continued or worsening symptoms

## 2020-06-19 NOTE — ED Triage Notes (Signed)
Triaged by provider  

## 2020-06-20 ENCOUNTER — Ambulatory Visit: Payer: Self-pay

## 2023-01-05 ENCOUNTER — Encounter (HOSPITAL_BASED_OUTPATIENT_CLINIC_OR_DEPARTMENT_OTHER): Payer: Self-pay | Admitting: Emergency Medicine

## 2023-01-05 ENCOUNTER — Other Ambulatory Visit: Payer: Self-pay

## 2023-01-05 DIAGNOSIS — S8992XA Unspecified injury of left lower leg, initial encounter: Secondary | ICD-10-CM | POA: Diagnosis present

## 2023-01-05 DIAGNOSIS — J45909 Unspecified asthma, uncomplicated: Secondary | ICD-10-CM | POA: Diagnosis not present

## 2023-01-05 DIAGNOSIS — Y9389 Activity, other specified: Secondary | ICD-10-CM | POA: Diagnosis not present

## 2023-01-05 DIAGNOSIS — S8012XA Contusion of left lower leg, initial encounter: Secondary | ICD-10-CM | POA: Diagnosis not present

## 2023-01-05 DIAGNOSIS — W208XXA Other cause of strike by thrown, projected or falling object, initial encounter: Secondary | ICD-10-CM | POA: Diagnosis not present

## 2023-01-05 NOTE — ED Triage Notes (Signed)
Pt ambulatory into triage in with L leg bruising, pain and swelling after injury sustained on Saturday. Pt states he was cutting wood, and a large log struck and rolled down his L shin. Bruising and swelling increased today. No thinners reported

## 2023-01-06 ENCOUNTER — Other Ambulatory Visit (HOSPITAL_BASED_OUTPATIENT_CLINIC_OR_DEPARTMENT_OTHER): Payer: BC Managed Care – PPO | Admitting: Radiology

## 2023-01-06 ENCOUNTER — Emergency Department (HOSPITAL_BASED_OUTPATIENT_CLINIC_OR_DEPARTMENT_OTHER): Payer: Managed Care, Other (non HMO) | Admitting: Radiology

## 2023-01-06 ENCOUNTER — Emergency Department (HOSPITAL_BASED_OUTPATIENT_CLINIC_OR_DEPARTMENT_OTHER)
Admission: EM | Admit: 2023-01-06 | Discharge: 2023-01-06 | Disposition: A | Discharge status: Home / Self Care | Payer: Managed Care, Other (non HMO) | Attending: Emergency Medicine | Admitting: Emergency Medicine

## 2023-01-06 ENCOUNTER — Emergency Department (HOSPITAL_BASED_OUTPATIENT_CLINIC_OR_DEPARTMENT_OTHER)
Admission: RE | Admit: 2023-01-06 | Discharge: 2023-01-06 | Disposition: A | Discharge status: Home / Self Care | Payer: Managed Care, Other (non HMO) | Source: Ambulatory Visit | Attending: Emergency Medicine | Admitting: Emergency Medicine

## 2023-01-06 DIAGNOSIS — T148XXA Other injury of unspecified body region, initial encounter: Secondary | ICD-10-CM

## 2023-01-06 DIAGNOSIS — S8012XA Contusion of left lower leg, initial encounter: Secondary | ICD-10-CM

## 2023-01-06 NOTE — ED Provider Notes (Signed)
[place] EMERGENCY DEPARTMENT AT [facility] Provider Note   CSN: [id number] Arrival date & time: 01/05/23  2325     History  Chief Complaint  Patient presents with   Leg Pain   Leg Swelling    Louis Woodward is a 32 y.o. male.  HPI     This is a 32 year old male who presents with an injury to the left lower extremity.  Patient reports on Saturday he was cutting wood from a tree.  He states that a large piece of wood fell from approximately shoulder height landing and hitting his left leg.  He noted some abrasion.  He since has developed increased bruising and swelling today.  Denies significant pain.  He has been ambulatory on that extremity and has gone to work.  Denies significant pain but does report some "tightness."  Patient is not on blood thinners.  Home Medications Prior to Admission medications   Medication Sig Start Date End Date Taking? Authorizing Provider  diphenoxylate-atropine (LOMOTIL) 2.5-0.025 MG tablet Take 1 tablet by mouth 4 (four) times daily as needed for diarrhea or loose stools. 07/27/15   Tharon Aquas, PA  Loperamide HCl (IMODIUM PO) Take by mouth. Reported on 08/20/2015    [provider]  metroNIDAZOLE (FLAGYL) 500 MG tablet Take 1 tablet (500 mg total) by mouth 3 (three) times daily. 08/23/15   Sherren Mocha, MD  Simethicone (GAS-X PO) Take by mouth.    [provider]      Allergies    Risperdal [risperidone], Amoxicillin er, and Cefazolin    Review of Systems   Review of Systems  Musculoskeletal:        Leg pain and bruising  Skin:  Positive for wound.  All other systems reviewed and are negative.   Physical Exam Updated Vital Signs BP (!) 142/80 (BP Location: Right Arm)   Pulse 95   Temp 98.4 F (36.9 C) (Oral)   Resp 17   Wt 124.7 kg   SpO2 99%   BMI 37.30 kg/m  Physical Exam Vitals and nursing note reviewed.  Constitutional:      Appearance: He is well-developed. He is obese. He is not ill-appearing.   HENT:     Head: Normocephalic and atraumatic.  Eyes:     Pupils: Pupils are equal, round, and reactive to light.  Cardiovascular:     Rate and Rhythm: Normal rate and regular rhythm.  Pulmonary:     Effort: Pulmonary effort is normal. No respiratory distress.  Abdominal:     Palpations: Abdomen is soft.     Tenderness: There is no abdominal tenderness.  Musculoskeletal:     Cervical back: Neck supple.     Comments: Focused examination of the left lower extremity shows an abrasion to the anterior shin, no adjacent erythema, patient has extensive ecchymosis extending into the posterior calf, ankle, and foot with associated swelling.  2+ DP pulse  Lymphadenopathy:     Cervical: No cervical adenopathy.  Skin:    General: Skin is warm and dry.  Neurological:     Mental Status: He is alert and oriented to person, place, and time.  Psychiatric:        Mood and Affect: Mood normal.     ED Results / Procedures / Treatments   Labs (all labs ordered are listed, but only abnormal results are displayed) Labs Reviewed - No data to display  EKG None  Radiology DG Tibia/Fibula Left  Result Date: 01/06/2023 CLINICAL DATA:  Left leg injury with bruising, pain, and swelling. EXAM: LEFT TIBIA AND FIBULA - 2 VIEW COMPARISON:  None Available. FINDINGS: There is no evidence of fracture or other focal bone lesions. Subcutaneous soft tissue edema is noted. IMPRESSION: No acute fracture. Electronically Signed   By: Thornell Sartorius M.D.   On: 01/06/2023 00:12    Procedures Procedures    Medications Ordered in ED Medications - No data to display  ED Course/ Medical Decision Making/ A&P                             Medical Decision Making Amount and/or Complexity of Data Reviewed Radiology: ordered.   This patient presents to the ED for concern of leg pain and swelling, this involves an extensive number of treatment options, and is a complaint that carries with it a high risk of complications  and morbidity.  I considered the following differential and admission for this acute, potentially life threatening condition.  The differential diagnosis includes hematoma, fracture, cellulitis, DVT  MDM:    This is a 32 year old male who presents with injury to the left lower extremity.  Progressive bruising and swelling of the left lower extremity.  Patient has been on his feet and has gone to work which is likely making this worse.  Denies significant pain but does report some tightness.  The abrasion to the anterior shin does not appear acutely infected.  X-rays do not show any evidence of an acute fracture.  Recommend compression, ice, elevation.  Do not have access to ultrasound imaging at this time but will have him return to rule out traumatic DVT.  Patient is agreeable to plan.  (Labs, imaging, consults)  Labs: I Ordered, and personally interpreted labs.  The pertinent results include: None  Imaging Studies ordered: I ordered imaging studies including none I independently visualized and interpreted imaging. I agree with the radiologist interpretation  Additional history obtained from chart review.  External records from outside source obtained and reviewed including prior evaluations  Cardiac Monitoring: The patient was not maintained on a cardiac monitor.  If on the cardiac monitor, I personally viewed and interpreted the cardiac monitored which showed an underlying rhythm of: N/A  Reevaluation: After the interventions noted above, I reevaluated the patient and found that they have :stayed the same  Social Determinants of Health:  lives independently  Disposition: Discharge  Co morbidities that complicate the patient evaluation  Past Medical History:  Diagnosis Date   Allergy    Asthma      Medicines No orders of the defined types were placed in this encounter.   I have reviewed the patients home medicines and have made adjustments as needed  Problem List / ED  Course: Problem List Items Addressed This Visit   None Visit Diagnoses     Contusion of left lower extremity, initial encounter    -  Primary   Abrasion                       Final Clinical Impression(s) / ED Diagnoses Final diagnoses:  Contusion of left lower extremity, initial encounter  Abrasion    Rx / DC Orders ED Discharge Orders          Ordered    US Venous Img Lower Unilateral Left        01/06/23 0146              Breyer Tejera,  Mayer Masker, MD 01/06/23 [phone]

## 2023-01-06 NOTE — Discharge Instructions (Signed)
You were seen today for discoloration and swelling of the left lower leg.  You have significant contusion and hematoma of the left calf.  This is likely related to trauma.  You need to keep your leg elevated.  You may use a Ace wrap to provide some support and compression.  Regarding the abrasion, you need to keep it clean and apply bacitracin ointment.  Follow-up later today for ultrasound imaging to rule out blood clot.

## 2023-01-06 NOTE — ED Notes (Signed)
Patient information given to radiology for them to call him to schedule appointment for [place] of left leg.

## 2023-08-04 ENCOUNTER — Encounter: Payer: Self-pay | Admitting: Internal Medicine
